# Patient Record
Sex: Female | Born: 1995 | Race: Black or African American | Hispanic: No | Marital: Single | Smoking: Former smoker
Health system: Southern US, Community
[De-identification: ages and names within clinical notes are randomized; demographics above are authoritative.]

## PROBLEM LIST (undated history)

## (undated) DIAGNOSIS — N059 Unspecified nephritic syndrome with unspecified morphologic changes: Secondary | ICD-10-CM

## (undated) DIAGNOSIS — A549 Gonococcal infection, unspecified: Secondary | ICD-10-CM

## (undated) DIAGNOSIS — A5901 Trichomonal vulvovaginitis: Secondary | ICD-10-CM

## (undated) DIAGNOSIS — I10 Essential (primary) hypertension: Secondary | ICD-10-CM

## (undated) DIAGNOSIS — N049 Nephrotic syndrome with unspecified morphologic changes: Secondary | ICD-10-CM

## (undated) HISTORY — PX: RENAL BIOPSY: SHX156

## (undated) HISTORY — DX: Essential (primary) hypertension: I10

## (undated) HISTORY — DX: Trichomonal vulvovaginitis: A59.01

## (undated) HISTORY — DX: Gonococcal infection, unspecified: A54.9

---
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
## 2017-01-21 ENCOUNTER — Encounter (HOSPITAL_COMMUNITY): Payer: Self-pay | Admitting: *Deleted

## 2017-01-21 ENCOUNTER — Emergency Department (HOSPITAL_COMMUNITY)
Admission: EM | Admit: 2017-01-21 | Discharge: 2017-01-21 | Disposition: A | Discharge status: Home / Self Care | Payer: Medicaid Other | Attending: Emergency Medicine | Admitting: Emergency Medicine

## 2017-01-21 DIAGNOSIS — N182 Chronic kidney disease, stage 2 (mild): Secondary | ICD-10-CM | POA: Diagnosis not present

## 2017-01-21 DIAGNOSIS — R040 Epistaxis: Secondary | ICD-10-CM

## 2017-01-21 DIAGNOSIS — Z79899 Other long term (current) drug therapy: Secondary | ICD-10-CM | POA: Diagnosis not present

## 2017-01-21 DIAGNOSIS — I129 Hypertensive chronic kidney disease with stage 1 through stage 4 chronic kidney disease, or unspecified chronic kidney disease: Secondary | ICD-10-CM | POA: Diagnosis not present

## 2017-01-21 HISTORY — DX: Nephrotic syndrome with unspecified morphologic changes: N04.9

## 2017-01-21 MED ORDER — SILVER NITRATE-POT NITRATE 75-25 % EX MISC
1.0000 | Freq: Once | CUTANEOUS | Status: AC
  Administered 2017-01-21: 1 via TOPICAL
  Filled 2017-01-21: qty 1

## 2017-01-21 NOTE — ED Triage Notes (Signed)
Per pt's friend, pt has had a cold and started having a nosebleed tonight. Pt with tshirt to nose, minimal amount of bleeding noted to shirt

## 2017-01-21 NOTE — ED Provider Notes (Signed)
[facility]-EMERGENCY DEPT Provider Note   CSN: [id number] Arrival date & time: 01/21/17  0147  By signing my name below, I, Bing Neighbors., attest that this documentation has been prepared under the direction and in the presence of Tomasita Crumble, MD. Electronically signed: Bing Neighbors., ED Scribe. 01/21/17. 2:11 AM.   History   Chief Complaint Chief Complaint  Patient presents with  . Epistaxis    HPI  Kelsey Ramsey is a 21 y.o. female who presents to the Emergency Department complaining of epistaxis with sudden onset x30 minutes. Pt states that she became hot x30 minutes ago and started experiencing epistaxis from the R naris. This episode lasted approximately x10 minutes and pt states that nothing relieved the nosebleed. Pt reports sneezing, congestion, cough. She denies any modifying factors. Pt denies blood thinner use. Of note, pt states that she has been blowing her nose excessively lately due to a recent illness.    HPI  Past Medical History:  Diagnosis Date  . Hypertension   . Nephritic syndrome   . Nephrotic syndrome     Patient Active Problem List   Diagnosis Date Noted  . 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 Nephrotic syndrome, focal and segmental glomerular lesions 04/06/2016  . Renal hypertension 04/06/2016  . Abdominal wall abscess 10/04/2015  . Abdominal pain, left upper quadrant 09/27/2015  . Subcutaneous mass 09/27/2015  . Chronic kidney disease 08/10/2014  . Chronic kidney disease (CKD) stage G3a/A2, moderately decreased glomerular filtration rate (GFR) between 45-59 mL/min/1.73 square meter and albuminuria creatinine ratio between 30-299 mg/g 08/10/2014  . Therapeutic drug monitoring 05/12/2012  . History of TB (tuberculosis) 02/11/2012  . History of tuberculosis 02/11/2012    Past Surgical History:  Procedure Laterality Date  . RENAL  BIOPSY      OB History    Gravida Para Term Preterm AB Living   0 0 0 0 0 0   SAB TAB Ectopic Multiple Live Births   0 0 0 0 0       Home Medications    Prior to Admission medications   Medication Sig Start Date End Date Taking? Authorizing Provider  clindamycin-benzoyl peroxide (BENZACLIN) gel Apply 1 application topically at bedtime. For acne 04/06/16   Historical Provider, MD  ibuprofen (ADVIL,MOTRIN) 600 MG tablet Take 1 tablet (600 mg total) by mouth every 6 (six) hours as needed for fever or mild pain. 11/01/14   Marcellina Millin, MD  labetalol (NORMODYNE) 300 MG tablet Take 300 mg by mouth 2 (two) times daily.    Historical Provider, MD  losartan (COZAAR) 100 MG tablet Take 1 tablet by mouth daily. 03/17/16   Historical Provider, MD  medroxyPROGESTERone (DEPO-PROVERA) 150 MG/ML injection Inject 1 mL (150 mg total) into the muscle every 3 (three) months. 07/02/16   Brock Bad, MD    Family History Family History  Problem Relation Age of Onset  . Diabetes Paternal Grandfather   . Stroke Maternal Grandmother   . Cancer Maternal Grandfather   . Diabetes Maternal Grandfather   . Hypertension Mother   . Heart disease Mother   . Thyroid disease Mother     Social History Social History  Substance Use Topics  . Smoking status: Never Smoker  . Smokeless tobacco: Never Used  . Alcohol use No     Allergies   Pineapple   Review of Systems Review of Systems  A  complete 10 system review of systems was obtained and all systems are negative except as noted in the HPI and PMH.    Physical Exam Updated Vital Signs BP 139/83 (BP Location: Left Arm)   Pulse 114   Temp 99.2 F (37.3 C) (Oral)   Resp 16   SpO2 96%   Physical Exam  Constitutional: She is oriented to person, place, and time. She appears well-developed and well-nourished. No distress.  HENT:  Head: Normocephalic and atraumatic.  Nose: Epistaxis is observed.  Mouth/Throat: Oropharynx is clear and  moist. No oropharyngeal exudate.  Dried blood in the R naris.   Eyes: Conjunctivae and EOM are normal. Pupils are equal, round, and reactive to light. No scleral icterus.  Neck: Normal range of motion. Neck supple. No JVD present. No tracheal deviation present. No thyromegaly present.  Cardiovascular: Normal rate, regular rhythm and normal heart sounds.  Exam reveals no gallop and no friction rub.   No murmur heard. Pulmonary/Chest: Effort normal and breath sounds normal. No respiratory distress. She has no wheezes. She exhibits no tenderness.  Abdominal: Soft. Bowel sounds are normal. She exhibits no distension and no mass. There is no tenderness. There is no rebound and no guarding.  Musculoskeletal: Normal range of motion. She exhibits no edema or tenderness.  Lymphadenopathy:    She has no cervical adenopathy.  Neurological: She is alert and oriented to person, place, and time. Tremors: .aon. No cranial nerve deficit. She exhibits normal muscle tone.  Normal strength and sensation in all extremities, normal cerebellar testing.     Skin: Skin is warm and dry. No rash noted. No erythema. No pallor.  Nursing note and vitals reviewed.    ED Treatments / Results   DIAGNOSTIC STUDIES: Oxygen Saturation is 96% on RA, adequate by my interpretation.   COORDINATION OF CARE: 2:11 AM-Discussed next steps with pt. Pt verbalized understanding and is agreeable with the plan.    Labs (all labs ordered are listed, but only abnormal results are displayed) Labs Reviewed - No data to display  EKG  EKG Interpretation None       Radiology No results found.  Procedures Procedures (including critical care time)  Medications Ordered in ED Medications  silver nitrate applicators applicator 1 Stick (not administered)     Initial Impression / Assessment and Plan / ED Course  I have reviewed the triage vital signs and the nursing notes.  Pertinent labs & imaging results that were  available during my care of the patient were reviewed by me and considered in my medical decision making (see chart for details).     Patient presents to the ED for epistaxis on the R side.  It has resolved and there is no further evidence or active bleed.  She was advised of nasal precautions going home and given PCP fu.  She appears well and in NAD. Vs remain within her normal limits and she is safe for DC.     Final Clinical Impressions(s) / ED Diagnoses   Final diagnoses:  None    New Prescriptions New Prescriptions   No medications on file      I personally performed the services described in this documentation, which was scribed in my presence. The recorded information has been reviewed and is accurate.       Tomasita CrumbleAdeleke Aitana Burry, MD 01/21/17 [phone]

## 2017-02-03 ENCOUNTER — Encounter (HOSPITAL_COMMUNITY): Payer: Self-pay | Admitting: *Deleted

## 2017-02-03 ENCOUNTER — Inpatient Hospital Stay (HOSPITAL_COMMUNITY)
Admission: AD | Admit: 2017-02-03 | Discharge: 2017-02-03 | Disposition: A | Discharge status: Home / Self Care | Payer: Medicaid Other | Source: Ambulatory Visit | Attending: Family Medicine | Admitting: Family Medicine

## 2017-02-03 DIAGNOSIS — N898 Other specified noninflammatory disorders of vagina: Secondary | ICD-10-CM | POA: Insufficient documentation

## 2017-02-03 DIAGNOSIS — X58XXXA Exposure to other specified factors, initial encounter: Secondary | ICD-10-CM | POA: Diagnosis not present

## 2017-02-03 DIAGNOSIS — L259 Unspecified contact dermatitis, unspecified cause: Secondary | ICD-10-CM | POA: Diagnosis not present

## 2017-02-03 DIAGNOSIS — N049 Nephrotic syndrome with unspecified morphologic changes: Secondary | ICD-10-CM | POA: Diagnosis not present

## 2017-02-03 DIAGNOSIS — I10 Essential (primary) hypertension: Secondary | ICD-10-CM | POA: Diagnosis not present

## 2017-02-03 NOTE — Discharge Instructions (Signed)
Bacitracin; Neomycin; Polymyxin B skin ointment What is this medicine? BACITRACIN; NEOMYCIN; POLYMYXIN (bass i TRAY sin; nee oh MYE sin; pol i MIX in) is used to treat skin infections. This medicine may be used for other purposes; ask your health care provider or pharmacist if you have questions. COMMON BRAND NAME(S): Neosporin What should I tell my health care provider before I take this medicine? They need to know if you have any of these conditions: -animal bite -deep wound -serious burn -an unusual or allergic reaction to this bacitracin, neomycin, polymyxin, other medicines, foods, dyes, or preservatives -pregnant or trying to get pregnant -breast-feeding How should I use this medicine? This medicine is for external use only. Do not take by mouth. Follow the directions on the prescription label. Wash hands before and after use. Apply a thin film of medicine to the affected area. Use your doses at regular intervals. Do not use your medicine more often than directed. Talk to your pediatrician regarding the use of this medicine in children. Special care may be needed. Overdosage: If you think you have taken too much of this medicine contact a poison control center or emergency room at once. NOTE: This medicine is only for you. Do not share this medicine with others. What if I miss a dose? If you miss a dose, use it as soon as you can. If it is almost time for your next dose, use only that dose. Do not use double or extra doses. What may interact with this medicine? Interactions are not expected. Do not use any other skin products on the affected area without asking your doctor or health care professional. This list may not describe all possible interactions. Give your health care provider a list of all the medicines, herbs, non-prescription drugs, or dietary supplements you use. Also tell them if you smoke, drink alcohol, or use illegal drugs. Some items may interact with your medicine. What  should I watch for while using this medicine? Tell your doctor or health care professional if your symptoms do not get better or if they get worse. Do not use longer than 7 days unless instructed by your doctor. What side effects may I notice from receiving this medicine? Side effects that you should report to your doctor or health care professional as soon as possible: -allergic reactions like skin rash, itching or hives, swelling of the face, lips, or tongue -infection, redness, swelling of skin This list may not describe all possible side effects. Call your doctor for medical advice about side effects. You may report side effects to FDA at 1-800-FDA-1088. Where should I keep my medicine? Keep out of the reach of children. Store at room temperature between 20 and 25 degrees C (68 and 77 degrees F). Throw away any unused medicine after the expiration date. NOTE: This sheet is a summary. It may not cover all possible information. If you have questions about this medicine, talk to your doctor, pharmacist, or health care provider.  2018 Elsevier/Gold Standard (2015-12-12 11:00:08)  

## 2017-02-03 NOTE — MAU Note (Signed)
PT SAYS  SHE  SHAVED  HER PUBIC AREA ON   3-4   AND  AGAIN ON   3-6  - THEN  SHE WAS  SPRAYING  PERFUME  ON BODY  AND   IT GOT  ON PUBIC  AREA-   AND  NOW  BURNS   - WHERE   SHE  HAS  CUTS   .    TAKES  DEPO.    NO BLEEDING

## 2017-02-03 NOTE — MAU Provider Note (Signed)
Patient Kelsey Ramsey is a 21 year old non-pregnant female here with complaints of vaginal irritation after shaving and using body spray.  History   Patient states that she was shaving her labia on 3-4 and felt some irritation after she shaved She shaved a few days later again on the 6th  and felt like she cut herself. She then used body spray and bath salts to try to make the pain go away. When she then looked in a hand mirror she saw that she had cut herself. Over the past week she has been washing with body wash to try to make it feel better.    She denies any itching or tingling or unusual discharge or pain with urination.   She is here because she is worried and wants to be checked out.   She called [facility] but wasn't able to get an appointment until April.  CSN: [id number]  Arrival date and time: 02/03/17 0129   None     Chief Complaint  Patient presents with  . VAG IRRITATION   HPI  OB History    Gravida Para Term Preterm AB Living   0 0 0 0 0 0   SAB TAB Ectopic Multiple Live Births   0 0 0 0 0      Past Medical History:  Diagnosis Date  . Hypertension   . Nephritic syndrome   . Nephrotic syndrome     Past Surgical History:  Procedure Laterality Date  . RENAL BIOPSY      Family History  Problem Relation Age of Onset  . Diabetes Paternal Grandfather   . Stroke Maternal Grandmother   . Cancer Maternal Grandfather   . Diabetes Maternal Grandfather   . Hypertension Mother   . Heart disease Mother   . Thyroid disease Mother     Social History  Substance Use Topics  . Smoking status: Never Smoker  . Smokeless tobacco: Never Used  . Alcohol use No    Allergies:  Allergies  Allergen Reactions  . Pineapple Itching and Swelling    Prescriptions Prior to Admission  Medication Sig Dispense Refill Last Dose  . clindamycin-benzoyl peroxide (BENZACLIN) gel Apply 1 application topically at bedtime. For acne   Taking  . ibuprofen (ADVIL,MOTRIN) 600 MG tablet  Take 1 tablet (600 mg total) by mouth every 6 (six) hours as needed for fever or mild pain. 30 tablet 0 Taking  . labetalol (NORMODYNE) 300 MG tablet Take 300 mg by mouth 2 (two) times daily.   Taking  . losartan (COZAAR) 100 MG tablet Take 1 tablet by mouth daily.   Taking  . medroxyPROGESTERone (DEPO-PROVERA) 150 MG/ML injection Inject 1 mL (150 mg total) into the muscle every 3 (three) months. 1 mL 3 Taking    Review of Systems  Constitutional: Negative.   HENT: Negative.   Eyes: Negative.   Respiratory: Negative.   Cardiovascular: Negative.   Gastrointestinal: Negative.   Endocrine: Negative.   Genitourinary: Negative.   Musculoskeletal: Negative.   Allergic/Immunologic: Negative.   Neurological: Negative.   Hematological: Negative.   Psychiatric/Behavioral: Negative.    Physical Exam   Blood pressure 118/62, pulse 100, temperature 97.9 F (36.6 C), temperature source Oral, resp. rate 18, height 5\' 8"  (1.727 m), weight 87.9 kg (193 lb 12 oz).  Physical Exam  Constitutional: She is oriented to person, place, and time. She appears well-developed.  Neck: Normal range of motion.  Respiratory: Effort normal. No respiratory distress.  Genitourinary: Vagina normal.  Genitourinary Comments: NEFG with 7 small red lacerations on her labia. No pus, no oozing, no redness or signs of infection. Cool and dry to the touch.   Musculoskeletal: Normal range of motion.  Neurological: She is alert and oriented to person, place, and time.  Skin: Skin is warm and dry.  Psychiatric: She has a normal mood and affect.    MAU Course  Procedures  MDM -physical exam. Unlikely to be herpetic lesions as patient states that the irritation began immediately after shaving, and she felt no bumps, tingling or burning. Red areas look like small abrasions and not blisters that have been de-roofed.   Assessment and Plan   1. Skin irritation from shaving    2. Patient stable for discharge with  recommendations to apply neosporin at bedtime and in the morning, avoid immersion in water and stop using body washes and sprays. Abstain from intercourse until skin has healed.  Instructed patient to return to ED if she felt no relief after 3 days or make an appointment with [facility] to be seen next week.   Charlesetta Garibaldi Kooistra CNM 02/03/2017, 2:51 AM

## 2017-03-15 ENCOUNTER — Ambulatory Visit: Payer: Medicaid Other

## 2017-03-24 ENCOUNTER — Ambulatory Visit (INDEPENDENT_AMBULATORY_CARE_PROVIDER_SITE_OTHER): Payer: Medicaid Other

## 2017-03-24 VITALS — BP 136/83 | HR 98 | Wt 194.8 lb

## 2017-03-24 DIAGNOSIS — Z3042 Encounter for surveillance of injectable contraceptive: Secondary | ICD-10-CM | POA: Diagnosis not present

## 2017-03-24 MED ORDER — MEDROXYPROGESTERONE ACETATE 150 MG/ML IM SUSP
150.0000 mg | Freq: Once | INTRAMUSCULAR | Status: AC
  Administered 2017-03-24: 150 mg via INTRAMUSCULAR

## 2017-03-24 NOTE — Progress Notes (Signed)
Patient presents for DEPO. Given in RUOQ. Tolerated well.  Return for next DEPO 7/18-06/23/2017  Administrations This Visit    medroxyPROGESTERone (DEPO-PROVERA) injection 150 mg    Admin Date 03/24/2017 Action Given Dose 150 mg Route Intramuscular Administered By Maretta Bees, RMA

## 2017-04-18 ENCOUNTER — Emergency Department (HOSPITAL_COMMUNITY)
Admission: EM | Admit: 2017-04-18 | Discharge: 2017-04-18 | Disposition: A | Discharge status: Home / Self Care | Payer: Medicaid Other | Attending: Emergency Medicine | Admitting: Emergency Medicine

## 2017-04-18 ENCOUNTER — Encounter (HOSPITAL_COMMUNITY): Payer: Self-pay

## 2017-04-18 DIAGNOSIS — J020 Streptococcal pharyngitis: Secondary | ICD-10-CM | POA: Diagnosis not present

## 2017-04-18 DIAGNOSIS — Z79899 Other long term (current) drug therapy: Secondary | ICD-10-CM | POA: Insufficient documentation

## 2017-04-18 DIAGNOSIS — F341 Dysthymic disorder: Secondary | ICD-10-CM | POA: Insufficient documentation

## 2017-04-18 DIAGNOSIS — F329 Major depressive disorder, single episode, unspecified: Secondary | ICD-10-CM | POA: Diagnosis present

## 2017-04-18 DIAGNOSIS — N189 Chronic kidney disease, unspecified: Secondary | ICD-10-CM | POA: Insufficient documentation

## 2017-04-18 DIAGNOSIS — I129 Hypertensive chronic kidney disease with stage 1 through stage 4 chronic kidney disease, or unspecified chronic kidney disease: Secondary | ICD-10-CM | POA: Insufficient documentation

## 2017-04-18 LAB — RAPID URINE DRUG SCREEN, HOSP PERFORMED
AMPHETAMINES: NOT DETECTED
Barbiturates: NOT DETECTED
Benzodiazepines: NOT DETECTED
Cocaine: NOT DETECTED
Opiates: NOT DETECTED
Tetrahydrocannabinol: NOT DETECTED

## 2017-04-18 LAB — CBC
HEMATOCRIT: 42.1 % (ref 36.0–46.0)
HEMOGLOBIN: 14.6 g/dL (ref 12.0–15.0)
MCH: 29 pg (ref 26.0–34.0)
MCHC: 34.7 g/dL (ref 30.0–36.0)
MCV: 83.7 fL (ref 78.0–100.0)
PLATELETS: 230 10*3/uL (ref 150–400)
RBC: 5.03 MIL/uL (ref 3.87–5.11)
RDW: 14.3 % (ref 11.5–15.5)
WBC: 10.4 10*3/uL (ref 4.0–10.5)

## 2017-04-18 LAB — COMPREHENSIVE METABOLIC PANEL
ALBUMIN: 4.3 g/dL (ref 3.5–5.0)
ALT: 29 U/L (ref 14–54)
ANION GAP: 7 (ref 5–15)
AST: 36 U/L (ref 15–41)
Alkaline Phosphatase: 60 U/L (ref 38–126)
BUN: 17 mg/dL (ref 6–20)
CO2: 25 mmol/L (ref 22–32)
Calcium: 9.8 mg/dL (ref 8.9–10.3)
Chloride: 108 mmol/L (ref 101–111)
Creatinine, Ser: 1.25 mg/dL — ABNORMAL HIGH (ref 0.44–1.00)
GFR calc Af Amer: >60 mL/min (ref >60)
GFR calc non Af Amer: >60 mL/min (ref >60)
GLUCOSE: 94 mg/dL (ref 65–99)
POTASSIUM: 3.9 mmol/L (ref 3.5–5.1)
SODIUM: 140 mmol/L (ref 135–145)
Total Bilirubin: 0.3 mg/dL (ref 0.3–1.2)
Total Protein: 8.2 g/dL — ABNORMAL HIGH (ref 6.5–8.1)

## 2017-04-18 LAB — ETHANOL: Alcohol, Ethyl (B): <5 mg/dL (ref <5)

## 2017-04-18 LAB — SALICYLATE LEVEL

## 2017-04-18 LAB — I-STAT BETA HCG BLOOD, ED (MC, WL, AP ONLY)

## 2017-04-18 LAB — ACETAMINOPHEN LEVEL

## 2017-04-18 MED ORDER — AMOXICILLIN 500 MG PO CAPS: 1000.0000 mg | ORAL_CAPSULE | Freq: Two times a day (BID) | ORAL | 0 refills | Status: DC

## 2017-04-18 NOTE — ED Provider Notes (Signed)
[facility]-EMERGENCY DEPT Provider Note   CSN: [id number] Arrival date & time: 04/18/17  8295     History   Chief Complaint Chief Complaint  Patient presents with  . Depression    HPI Kelsey Ramsey is a 21 y.o. female.  21 yo F with a chief complaint of depression. Patient states that she's felt down for the past couple weeks to a month. She thinks is due to relationship issues and the fact that she's not getting along well with her siblings. She denies suicidal or homicidal ideation. Denies illegal drug use. Denies hallucinations.  Patient was noted to have a fever in triage. She said she's been having a sore throat. Mild headache. Has coughed once or twice over the past couple days. Going on for 2 or 3 days. Denies sick contacts.   The history is provided by the patient.  Depression  This is a new problem. The current episode started more than 2 days ago. The problem occurs constantly. The problem has not changed since onset.Pertinent negatives include no chest pain, no headaches and no shortness of breath. Nothing aggravates the symptoms. Nothing relieves the symptoms. She has tried nothing for the symptoms. The treatment provided no relief.    Past Medical History:  Diagnosis Date  . Hypertension   . Nephritic syndrome   . Nephrotic syndrome     Patient Active Problem List   Diagnosis Date Noted  . 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 Nephrotic syndrome, focal and segmental glomerular lesions 04/06/2016  . Renal hypertension 04/06/2016  . Abdominal wall abscess 10/04/2015  . Abdominal pain, left upper quadrant 09/27/2015  . Subcutaneous mass 09/27/2015  . Chronic kidney disease 08/10/2014  . Chronic kidney disease (CKD) stage G3a/A2, moderately decreased glomerular filtration rate (GFR) between 45-59 mL/min/1.73 square meter and albuminuria creatinine ratio between 30-299 mg/g  08/10/2014  . Therapeutic drug monitoring 05/12/2012  . History of TB (tuberculosis) 02/11/2012  . History of tuberculosis 02/11/2012    Past Surgical History:  Procedure Laterality Date  . RENAL BIOPSY      OB History    Gravida Para Term Preterm AB Living   0 0 0 0 0 0   SAB TAB Ectopic Multiple Live Births   0 0 0 0 0       Home Medications    Prior to Admission medications   Medication Sig Start Date End Date Taking? Authorizing Provider  losartan (COZAAR) 100 MG tablet Take 100 mg by mouth daily.  03/17/16  Yes [provider]  medroxyPROGESTERone (DEPO-PROVERA) 150 MG/ML injection Inject 1 mL (150 mg total) into the muscle every 3 (three) months. 07/02/16  Yes Brock Bad, MD  amoxicillin (AMOXIL) 500 MG capsule Take 2 capsules (1,000 mg total) by mouth 2 (two) times daily. 04/18/17   Melene Plan, DO    Family History Family History  Problem Relation Age of Onset  . Diabetes Paternal Grandfather   . Stroke Maternal Grandmother   . Cancer Maternal Grandfather   . Diabetes Maternal Grandfather   . Hypertension Mother   . Heart disease Mother   . Thyroid disease Mother     Social History Social History  Substance Use Topics  . Smoking status: Never Smoker  . Smokeless tobacco: Never Used  . Alcohol use No     Allergies   Pineapple   Review of Systems Review of Systems  Constitutional: Positive  for fever. Negative for chills.  HENT: Positive for sore throat. Negative for congestion and rhinorrhea.   Eyes: Negative for redness and visual disturbance.  Respiratory: Positive for cough. Negative for shortness of breath and wheezing.   Cardiovascular: Negative for chest pain and palpitations.  Gastrointestinal: Negative for nausea and vomiting.  Genitourinary: Negative for dysuria and urgency.  Musculoskeletal: Negative for arthralgias and myalgias.  Skin: Negative for pallor and wound.  Neurological: Negative for dizziness and headaches.    Psychiatric/Behavioral: Positive for depression and dysphoric mood. Negative for hallucinations, self-injury and suicidal ideas.     Physical Exam Updated Vital Signs BP (!) 144/82 (BP Location: Left Arm)   Pulse (!) 116   Temp (!) 100.5 F (38.1 C) (Oral)   Resp 18   Ht 5\' 8"  (1.727 m)   Wt 90.3 kg (199 lb)   SpO2 98%   BMI 30.26 kg/m   Physical Exam  Constitutional: She is oriented to person, place, and time. She appears well-developed and well-nourished. No distress.  HENT:  Head: Normocephalic and atraumatic.  Purulent tonsils bilaterally. Uvula is midline. Handling secretions without difficulty. Left-sided tender anterior cervical lymphadenopathy. TMs are normal.  Eyes: EOM are normal. Pupils are equal, round, and reactive to light.  Neck: Normal range of motion. Neck supple.  Cardiovascular: Normal rate and regular rhythm.  Exam reveals no gallop and no friction rub.   No murmur heard. Pulmonary/Chest: Effort normal. She has no wheezes. She has no rales.  Abdominal: Soft. She exhibits no distension. There is no tenderness.  Musculoskeletal: She exhibits no edema or tenderness.  Neurological: She is alert and oriented to person, place, and time.  Skin: Skin is warm and dry. She is not diaphoretic.  Psychiatric: She has a normal mood and affect. Her behavior is normal.  Nursing note and vitals reviewed.    ED Treatments / Results  Labs (all labs ordered are listed, but only abnormal results are displayed) Labs Reviewed  COMPREHENSIVE METABOLIC PANEL - Abnormal; Notable for the following:       Result Value   Creatinine, Ser 1.25 (*)    Total Protein 8.2 (*)    All other components within normal limits  ACETAMINOPHEN LEVEL - Abnormal; Notable for the following:    Acetaminophen (Tylenol), Serum <10 (*)    All other components within normal limits  ETHANOL  SALICYLATE LEVEL  CBC  RAPID URINE DRUG SCREEN, HOSP PERFORMED  I-STAT BETA HCG BLOOD, ED ([facility], [facility], [facility]  ONLY)    EKG  EKG Interpretation None       Radiology No results found.  Procedures Procedures (including critical care time)  Medications Ordered in ED Medications - No data to display   Initial Impression / Assessment and Plan / ED Course  I have reviewed the triage vital signs and the nursing notes.  Pertinent labs & imaging results that were available during my care of the patient were reviewed by me and considered in my medical decision making (see chart for details).     21 yo F With a chief complaint of depression. I discussed the limitations of the emergency department in offering counseling services. I discussed that we're here to screen the see if she is safe to be discharged home. The patient does not feel that she needs to be committed to a psychiatric facility. She denies suicidal or homicidal ideation. Denies hallucinations. I will give her a list of resources to see as an outpatient.  Patient also was  incidentally found to have a fever. Clinically she has strep pharyngitis. Will treat with antibiotics. PCP follow-up.  9:19 AM:  I have discussed the diagnosis/risks/treatment options with the patient and family and believe the pt to be eligible for discharge home to follow-up with Psych, PCP. We also discussed returning to the ED immediately if new or worsening sx occur. We discussed the sx which are most concerning (e.g., sudden worsening pain, fever, inability to tolerate by mouth, SI) that necessitate immediate return. Medications administered to the patient during their visit and any new prescriptions provided to the patient are listed below.  Medications given during this visit Medications - No data to display   The patient appears reasonably screen and/or stabilized for discharge and I doubt any other medical condition or other [facility] requiring further screening, evaluation, or treatment in the ED at this time prior to discharge.    Final Clinical  Impressions(s) / ED Diagnoses   Final diagnoses:  Dysthymia  Strep pharyngitis    New Prescriptions New Prescriptions   AMOXICILLIN (AMOXIL) 500 MG CAPSULE    Take 2 capsules (1,000 mg total) by mouth 2 (two) times daily.     Melene Plan, DO 04/18/17 [phone]

## 2017-04-18 NOTE — ED Notes (Signed)
Pt came in voluntarily for assessment due to family and relationship problems. Pt states SI, but also says she wouldn't do it.

## 2017-04-18 NOTE — ED Notes (Signed)
Pt c/o having a "rough time" feeling overwhelmed, depressed, anxious. Pt stated had suicidal ideations, but does not currently feel that way. Pt has had recent disagreements with boyfriend and family.

## 2017-04-18 NOTE — ED Notes (Signed)
Pt states she wants to leave. Asked patient to stay and see MD. Pt agreed, waiting for room.

## 2017-04-25 ENCOUNTER — Inpatient Hospital Stay (HOSPITAL_COMMUNITY)
Admission: AD | Admit: 2017-04-25 | Discharge: 2017-04-26 | Disposition: A | Discharge status: Home / Self Care | Payer: Medicaid Other | Source: Ambulatory Visit | Attending: Obstetrics and Gynecology | Admitting: Obstetrics and Gynecology

## 2017-04-25 DIAGNOSIS — Z113 Encounter for screening for infections with a predominantly sexual mode of transmission: Secondary | ICD-10-CM | POA: Insufficient documentation

## 2017-04-25 DIAGNOSIS — Z3202 Encounter for pregnancy test, result negative: Secondary | ICD-10-CM | POA: Diagnosis not present

## 2017-04-25 DIAGNOSIS — I10 Essential (primary) hypertension: Secondary | ICD-10-CM | POA: Diagnosis not present

## 2017-04-25 DIAGNOSIS — Z711 Person with feared health complaint in whom no diagnosis is made: Secondary | ICD-10-CM

## 2017-04-25 DIAGNOSIS — Z79899 Other long term (current) drug therapy: Secondary | ICD-10-CM | POA: Insufficient documentation

## 2017-04-25 LAB — POCT PREGNANCY, URINE: Preg Test, Ur: NEGATIVE

## 2017-04-25 NOTE — MAU Provider Note (Signed)
History     CSN: [id number]  Arrival date and time: 04/25/17 2323  First Provider Initiated Contact with Patient 04/25/17 2349      Chief Complaint  Patient presents with  . STD testing   HPI Kelsey Ramsey is a 21 y.o. female who presents for STD check. Patient states she was recently sexually active with new partner & is concerned for STDs. Denies abdominal pain, vaginal discharge, vaginal irritation, vaginal bleeding, or dysuria. States partner does not have STD symptoms, nor has he been tested.   Past Medical History:  Diagnosis Date  . Hypertension   . Nephritic syndrome   . Nephrotic syndrome     Past Surgical History:  Procedure Laterality Date  . RENAL BIOPSY      Family History  Problem Relation Age of Onset  . Diabetes Paternal Grandfather   . Stroke Maternal Grandmother   . Cancer Maternal Grandfather   . Diabetes Maternal Grandfather   . Hypertension Mother   . Heart disease Mother   . Thyroid disease Mother     Social History  Substance Use Topics  . Smoking status: Never Smoker  . Smokeless tobacco: Never Used  . Alcohol use No    Allergies:  Allergies  Allergen Reactions  . Pineapple Itching and Swelling    Prescriptions Prior to Admission  Medication Sig Dispense Refill Last Dose  . amoxicillin (AMOXIL) 500 MG capsule Take 2 capsules (1,000 mg total) by mouth 2 (two) times daily. 40 capsule 0   . losartan (COZAAR) 100 MG tablet Take 100 mg by mouth daily.    04/16/2017 at Unknown time  . medroxyPROGESTERone (DEPO-PROVERA) 150 MG/ML injection Inject 1 mL (150 mg total) into the muscle every 3 (three) months. 1 mL 3 May    Review of Systems  Constitutional: Negative.   Gastrointestinal: Negative.   Genitourinary: Negative.    Physical Exam   Blood pressure 136/72, pulse (!) 103, temperature 98.9 F (37.2 C), temperature source Oral, resp. rate 18, height 5' 9.5" (1.765 m), weight 182 lb (82.6 kg), SpO2 97 %.  Physical Exam  Nursing note  and vitals reviewed. Constitutional: She is oriented to person, place, and time. She appears well-developed and well-nourished. No distress.  HENT:  Head: Normocephalic and atraumatic.  Eyes: Conjunctivae are normal. Right eye exhibits no discharge. Left eye exhibits no discharge. No scleral icterus.  Neck: Normal range of motion.  Respiratory: Effort normal. No respiratory distress.  Neurological: She is alert and oriented to person, place, and time.  Skin: Skin is warm and dry. She is not diaphoretic.  Psychiatric: She has a normal mood and affect. Her behavior is normal. Judgment and thought content normal.    MAU Course  Procedures Results for orders placed or performed during the hospital encounter of 04/25/17 (from the past 24 hour(s))  Pregnancy, urine POC     Status: None   Collection Time: 04/25/17 11:48 PM  Result Value Ref Range   Preg Test, Ur NEGATIVE NEGATIVE  Wet prep, genital     Status: Abnormal   Collection Time: 04/25/17 11:55 PM  Result Value Ref Range   Yeast Wet Prep HPF POC NONE SEEN NONE SEEN   Trich, Wet Prep NONE SEEN NONE SEEN   Clue Cells Wet Prep HPF POC NONE SEEN NONE SEEN   WBC, Wet Prep HPF POC FEW (A) NONE SEEN   Sperm NONE SEEN    MDM UPT negative GC/CT, wet prep, HIV, RPR  Assessment and  Plan  A: 1. Screen for STD (sexually transmitted disease)   2. Pregnancy examination or test, negative result   3. Physically well but worried    P: Discharge home GC/CT, HIV, & RPR pending Abstain from sex until results of testing Discussed [facility] STD clinic in the future for concerns or f/u with ob/gyn Safe sex practices discussed  Judeth Horn 04/25/2017, 11:49 PM

## 2017-04-25 NOTE — MAU Note (Signed)
Pt states she wants to be checked for STD's. Pt denies vaginal bleeding or discharge. Pt denies pain. Pt currently on Depo-last injection was in May.

## 2017-04-26 DIAGNOSIS — Z113 Encounter for screening for infections with a predominantly sexual mode of transmission: Secondary | ICD-10-CM | POA: Diagnosis not present

## 2017-04-26 LAB — WET PREP, GENITAL
Clue Cells Wet Prep HPF POC: NONE SEEN
SPERM: NONE SEEN
TRICH WET PREP: NONE SEEN
YEAST WET PREP: NONE SEEN

## 2017-04-26 LAB — RPR: RPR Ser Ql: NONREACTIVE

## 2017-04-26 LAB — HIV ANTIBODY (ROUTINE TESTING W REFLEX): HIV Screen 4th Generation wRfx: NONREACTIVE

## 2017-04-26 NOTE — Discharge Instructions (Signed)

## 2017-04-27 LAB — GC/CHLAMYDIA PROBE AMP (~~LOC~~) NOT AT ARMC
Chlamydia: NEGATIVE
NEISSERIA GONORRHEA: NEGATIVE

## 2017-04-30 ENCOUNTER — Encounter (HOSPITAL_COMMUNITY): Payer: Self-pay

## 2017-04-30 ENCOUNTER — Inpatient Hospital Stay (HOSPITAL_COMMUNITY)
Admission: AD | Admit: 2017-04-30 | Discharge: 2017-04-30 | Disposition: A | Discharge status: Home / Self Care | Payer: Medicaid Other | Source: Ambulatory Visit | Attending: Obstetrics & Gynecology | Admitting: Obstetrics & Gynecology

## 2017-04-30 DIAGNOSIS — N921 Excessive and frequent menstruation with irregular cycle: Secondary | ICD-10-CM | POA: Diagnosis not present

## 2017-04-30 DIAGNOSIS — R109 Unspecified abdominal pain: Secondary | ICD-10-CM | POA: Diagnosis not present

## 2017-04-30 DIAGNOSIS — N946 Dysmenorrhea, unspecified: Secondary | ICD-10-CM | POA: Insufficient documentation

## 2017-04-30 DIAGNOSIS — N939 Abnormal uterine and vaginal bleeding, unspecified: Secondary | ICD-10-CM | POA: Diagnosis present

## 2017-04-30 LAB — COMPREHENSIVE METABOLIC PANEL
ALK PHOS: 60 U/L (ref 38–126)
ALT: 25 U/L (ref 14–54)
ANION GAP: 8 (ref 5–15)
AST: 29 U/L (ref 15–41)
Albumin: 4 g/dL (ref 3.5–5.0)
BILIRUBIN TOTAL: 0.3 mg/dL (ref 0.3–1.2)
BUN: 12 mg/dL (ref 6–20)
CALCIUM: 9.8 mg/dL (ref 8.9–10.3)
CO2: 26 mmol/L (ref 22–32)
Chloride: 104 mmol/L (ref 101–111)
Creatinine, Ser: 1.06 mg/dL — ABNORMAL HIGH (ref 0.44–1.00)
GFR calc Af Amer: >60 mL/min (ref >60)
Glucose, Bld: 85 mg/dL (ref 65–99)
Potassium: 3.7 mmol/L (ref 3.5–5.1)
Sodium: 138 mmol/L (ref 135–145)
TOTAL PROTEIN: 8.1 g/dL (ref 6.5–8.1)

## 2017-04-30 LAB — URINALYSIS, ROUTINE W REFLEX MICROSCOPIC
Bacteria, UA: NONE SEEN
Bilirubin Urine: NEGATIVE
GLUCOSE, UA: NEGATIVE mg/dL
Ketones, ur: NEGATIVE mg/dL
NITRITE: NEGATIVE
PROTEIN: 100 mg/dL — AB
SPECIFIC GRAVITY, URINE: 1.015 (ref 1.005–1.030)
pH: 5 (ref 5.0–8.0)

## 2017-04-30 LAB — LIPASE, BLOOD: Lipase: 40 U/L (ref 11–51)

## 2017-04-30 LAB — CBC
HEMATOCRIT: 40.9 % (ref 36.0–46.0)
HEMOGLOBIN: 14.2 g/dL (ref 12.0–15.0)
MCH: 28.9 pg (ref 26.0–34.0)
MCHC: 34.7 g/dL (ref 30.0–36.0)
MCV: 83.3 fL (ref 78.0–100.0)
Platelets: 202 10*3/uL (ref 150–400)
RBC: 4.91 MIL/uL (ref 3.87–5.11)
RDW: 14.1 % (ref 11.5–15.5)
WBC: 6.5 10*3/uL (ref 4.0–10.5)

## 2017-04-30 LAB — POCT PREGNANCY, URINE: Preg Test, Ur: NEGATIVE

## 2017-04-30 MED ORDER — IBUPROFEN 600 MG PO TABS: 600.0000 mg | ORAL_TABLET | Freq: Four times a day (QID) | ORAL | 0 refills | Status: DC | PRN

## 2017-04-30 MED ORDER — IBUPROFEN 600 MG PO TABS
600.0000 mg | ORAL_TABLET | Freq: Four times a day (QID) | ORAL | Status: DC | PRN
  Administered 2017-04-30: 600 mg via ORAL
  Filled 2017-04-30: qty 1

## 2017-04-30 NOTE — MAU Provider Note (Signed)
Chief Complaint: Vaginal Bleeding and Abdominal Pain   First Provider Initiated Contact with Patient 04/30/17 1621      SUBJECTIVE HPI: Kelsey Ramsey is a 21 y.o. G0P0000 who presents to maternity admissions reporting abdominal cramping and abdominal pain starting yesterday. She reports the cramping started first, feeling like menstrual cramps, intermittent pain that is unchanged since onset.  Then, the bleeding started, light bleeding requiring a pantyliner only. She is on Depo Provera and does not usually have periods. She started amoxicillin a few days ago for strep throat. She took a pain medication from her mother today that did not help.  The pain does not radiate and bleeding is the only associated symptom.  She had STD testing on 04/25/17 which was negative and denies any sexual activity in recent weeks.  She is interested in other forms of birth control because of her weight gain on Depo. [facility]. She denies vaginal itching/burning, urinary symptoms, h/a, dizziness, n/v, or fever/chills.     HPI  Past Medical History:  Diagnosis Date  . Hypertension   . Nephritic syndrome   . Nephrotic syndrome    Past Surgical History:  Procedure Laterality Date  . RENAL BIOPSY     Social History   Social History  . Marital status: Single    Spouse name: N/A  . Number of children: N/A  . Years of education: N/A   Occupational History  . Not on file.   Social History Main Topics  . Smoking status: Never Smoker  . Smokeless tobacco: Never Used  . Alcohol use No  . Drug use: No  . Sexual activity: Not Currently    Birth control/ protection: Injection   Other Topics Concern  . Not on file   Social History Narrative  . No narrative on file   No current facility-administered medications on file prior to encounter.    Current Outpatient Prescriptions on File Prior to Encounter  Medication Sig Dispense Refill  . amoxicillin (AMOXIL)  500 MG capsule Take 2 capsules (1,000 mg total) by mouth 2 (two) times daily. 40 capsule 0  . losartan (COZAAR) 100 MG tablet Take 100 mg by mouth daily.     . medroxyPROGESTERone (DEPO-PROVERA) 150 MG/ML injection Inject 1 mL (150 mg total) into the muscle every 3 (three) months. 1 mL 3   Allergies  Allergen Reactions  . Pineapple Itching and Swelling    ROS:  Review of Systems  Constitutional: Negative for chills, fatigue and fever.  Respiratory: Negative for shortness of breath.   Cardiovascular: Negative for chest pain.  Gastrointestinal: Positive for abdominal pain.  Genitourinary: Positive for pelvic pain. Negative for difficulty urinating, dysuria, flank pain, vaginal bleeding, vaginal discharge and vaginal pain.  Neurological: Negative for dizziness and headaches.  Psychiatric/Behavioral: Negative.      I have reviewed patient's Past Medical Hx, Surgical Hx, Family Hx, Social Hx, medications and allergies.   Physical Exam   Patient Vitals for the past 24 hrs:  BP Temp Temp src Pulse Resp Weight  04/30/17 1522 126/75 98.9 F (37.2 C) Oral 97 18 195 lb 4 oz (88.6 kg)   Constitutional: Well-developed, well-nourished female in no acute distress.  Cardiovascular: normal rate Respiratory: normal effort GI: Abd soft, non-tender. Pos BS x 4 MS: Extremities nontender, no edema, normal ROM Neurologic: Alert and oriented x 4.  GU: Neg CVAT.  PELVIC EXAM: Deferred   LAB RESULTS Results for orders placed or  performed during the hospital encounter of 04/30/17 (from the past 24 hour(s))  Pregnancy, urine POC     Status: None   Collection Time: 04/30/17  3:34 PM  Result Value Ref Range   Preg Test, Ur NEGATIVE NEGATIVE  Urinalysis, Routine w reflex microscopic     Status: Abnormal   Collection Time: 04/30/17  3:35 PM  Result Value Ref Range   Color, Urine YELLOW YELLOW   APPearance HAZY (A) CLEAR   Specific Gravity, Urine 1.015 1.005 - 1.030   pH 5.0 5.0 - 8.0   Glucose,  UA NEGATIVE NEGATIVE mg/dL   Hgb urine dipstick LARGE (A) NEGATIVE   Bilirubin Urine NEGATIVE NEGATIVE   Ketones, ur NEGATIVE NEGATIVE mg/dL   Protein, ur 161 (A) NEGATIVE mg/dL   Nitrite NEGATIVE NEGATIVE   Leukocytes, UA TRACE (A) NEGATIVE   RBC / HPF 6-30 0 - 5 RBC/hpf   WBC, UA 0-5 0 - 5 WBC/hpf   Bacteria, UA NONE SEEN NONE SEEN   Squamous Epithelial / LPF 6-30 (A) NONE SEEN   Mucous PRESENT   CBC     Status: None   Collection Time: 04/30/17  4:51 PM  Result Value Ref Range   WBC 6.5 4.0 - 10.5 K/uL   RBC 4.91 3.87 - 5.11 MIL/uL   Hemoglobin 14.2 12.0 - 15.0 g/dL   HCT 09.6 04.5 - 40.9 %   MCV 83.3 78.0 - 100.0 fL   MCH 28.9 26.0 - 34.0 pg   MCHC 34.7 30.0 - 36.0 g/dL   RDW 81.1 91.4 - 78.2 %   Platelets 202 150 - 400 K/uL  Comprehensive metabolic panel     Status: Abnormal   Collection Time: 04/30/17  4:51 PM  Result Value Ref Range   Sodium 138 135 - 145 mmol/L   Potassium 3.7 3.5 - 5.1 mmol/L   Chloride 104 101 - 111 mmol/L   CO2 26 22 - 32 mmol/L   Glucose, Bld 85 65 - 99 mg/dL   BUN 12 6 - 20 mg/dL   Creatinine, Ser 9.56 (H) 0.44 - 1.00 mg/dL   Calcium 9.8 8.9 - 21.3 mg/dL   Total Protein 8.1 6.5 - 8.1 g/dL   Albumin 4.0 3.5 - 5.0 g/dL   AST 29 15 - 41 U/L   ALT 25 14 - 54 U/L   Alkaline Phosphatase 60 38 - 126 U/L   Total Bilirubin 0.3 0.3 - 1.2 mg/dL   GFR calc non Af Amer >60 >60 mL/min   GFR calc Af Amer >60 >60 mL/min   Anion gap 8 5 - 15  Lipase, blood     Status: None   Collection Time: 04/30/17  4:51 PM  Result Value Ref Range   Lipase 40 11 - 51 U/L       IMAGING No results found.  MAU Management/MDM: Ordered labs and reviewed results.  Likely irregular menstrual bleeding related to antibiotics on Depo.  Will treat with ibuprofen 600 mg PO here in MAU and Rx for home.  Pt to f/u with [facility] for change in contraceptive method if desired. Discussed LARCs as most effective forms of birth control. Pt stable at time of discharge.  ASSESSMENT 1.  Breakthrough bleeding on Depo-Provera   2. Dysmenorrhea in adolescent     PLAN Discharge home  Allergies as of 04/30/2017      Reactions   Pineapple Itching, Swelling      Medication List    TAKE these medications  amoxicillin 500 MG capsule Commonly known as:  AMOXIL Take 2 capsules (1,000 mg total) by mouth 2 (two) times daily.   BIOTIN PO Take 2 capsules by mouth daily. OTC vitamin, pt not sure of strength/name- for hair and nails   ibuprofen 600 MG tablet Commonly known as:  ADVIL,MOTRIN Take 1 tablet (600 mg total) by mouth every 6 (six) hours as needed for fever or headache.   losartan 100 MG tablet Commonly known as:  COZAAR Take 100 mg by mouth daily.   medroxyPROGESTERone 150 MG/ML injection Commonly known as:  DEPO-PROVERA Inject 1 mL (150 mg total) into the muscle every 3 (three) months.      Follow-up Information    [facility] Follow up.   Why:  For contraceptive management as needed Contact information: [address] [place] [address] [phone]       [facility] Follow up.   Specialty:  Behavioral Health Why:  Walk in or call for appointment as needed Contact information: [address] [place] [address] [phone]           Sharen Counter Certified Nurse-Midwife 04/30/2017  8:32 PM

## 2017-04-30 NOTE — MAU Note (Signed)
Is on Amoxicillin for strep throat.  Yesterday felt she was about to faint.  Was getting hot.  Got back home, stomach started hurting. Noted some blood. Took some medicine. Felt better, and bleeding stopped.  Today is having more bleeding, not heavy, just wearing pantiliner. Pain started again.  Feels like she is going to throw up, feels hot "on the inside".  Is on depo, last was in May.  So much going on , doesn't know what is wrong.

## 2017-06-16 ENCOUNTER — Ambulatory Visit: Payer: Medicaid Other

## 2017-06-21 ENCOUNTER — Telehealth: Payer: Self-pay

## 2017-06-21 NOTE — Telephone Encounter (Signed)
Pt called wanting to schedule an appt to discuss birth control. Pt transferred to front staff to schedule appt

## 2017-06-24 ENCOUNTER — Ambulatory Visit (INDEPENDENT_AMBULATORY_CARE_PROVIDER_SITE_OTHER): Payer: Medicaid Other | Admitting: Obstetrics and Gynecology

## 2017-06-24 ENCOUNTER — Encounter: Payer: Self-pay | Admitting: Obstetrics and Gynecology

## 2017-06-24 VITALS — BP 127/80 | HR 97 | Ht 68.0 in | Wt 197.6 lb

## 2017-06-24 DIAGNOSIS — Z3009 Encounter for other general counseling and advice on contraception: Secondary | ICD-10-CM

## 2017-06-24 DIAGNOSIS — Z308 Encounter for other contraceptive management: Secondary | ICD-10-CM

## 2017-06-24 LAB — POCT URINE PREGNANCY: Preg Test, Ur: NEGATIVE

## 2017-06-24 NOTE — Patient Instructions (Signed)
Contraception Choices Contraception (birth control) is the use of any methods or devices to prevent pregnancy. Below are some methods to help avoid pregnancy. Hormonal methods  Contraceptive implant. This is a thin, plastic tube containing progesterone hormone. It does not contain estrogen hormone. Your health care provider inserts the tube in the inner part of the upper arm. The tube can remain in place for up to 3 years. After 3 years, the implant must be removed. The implant prevents the ovaries from releasing an egg (ovulation), thickens the cervical mucus to prevent sperm from entering the uterus, and thins the lining of the inside of the uterus.  Progesterone-only injections. These injections are given every 3 months by your health care provider to prevent pregnancy. This synthetic progesterone hormone stops the ovaries from releasing eggs. It also thickens cervical mucus and changes the uterine lining. This makes it harder for sperm to survive in the uterus.  Birth control pills. These pills contain estrogen and progesterone hormone. They work by preventing the ovaries from releasing eggs (ovulation). They also cause the cervical mucus to thicken, preventing the sperm from entering the uterus. Birth control pills are prescribed by a health care provider.Birth control pills can also be used to treat heavy periods.  Minipill. This type of birth control pill contains only the progesterone hormone. They are taken every day of each month and must be prescribed by your health care provider.  Birth control patch. The patch contains hormones similar to those in birth control pills. It must be changed once a week and is prescribed by a health care provider.  Vaginal ring. The ring contains hormones similar to those in birth control pills. It is left in the vagina for 3 weeks, removed for 1 week, and then a new one is put back in place. The patient must be comfortable inserting and removing the ring from  the vagina.A health care provider's prescription is necessary.  Emergency contraception. Emergency contraceptives prevent pregnancy after unprotected sexual intercourse. This pill can be taken right after sex or up to 5 days after unprotected sex. It is most effective the sooner you take the pills after having sexual intercourse. Most emergency contraceptive pills are available without a prescription. Check with your pharmacist. Do not use emergency contraception as your only form of birth control. Barrier methods  Female condom. This is a thin sheath (latex or rubber) that is worn over the penis during sexual intercourse. It can be used with spermicide to increase effectiveness.  Female condom. This is a soft, loose-fitting sheath that is put into the vagina before sexual intercourse.  Diaphragm. This is a soft, latex, dome-shaped barrier that must be fitted by a health care provider. It is inserted into the vagina, along with a spermicidal jelly. It is inserted before intercourse. The diaphragm should be left in the vagina for 6 to 8 hours after intercourse.  Cervical cap. This is a round, soft, latex or plastic cup that fits over the cervix and must be fitted by a health care provider. The cap can be left in place for up to 48 hours after intercourse.  Sponge. This is a soft, circular piece of polyurethane foam. The sponge has spermicide in it. It is inserted into the vagina after wetting it and before sexual intercourse.  Spermicides. These are chemicals that kill or block sperm from entering the cervix and uterus. They come in the form of creams, jellies, suppositories, foam, or tablets. They do not require a prescription. They   are inserted into the vagina with an applicator before having sexual intercourse. The process must be repeated every time you have sexual intercourse. Intrauterine contraception  Intrauterine device (IUD). This is a T-shaped device that is put in a woman's uterus during  a menstrual period to prevent pregnancy. There are 2 types: ? Copper IUD. This type of IUD is wrapped in copper wire and is placed inside the uterus. Copper makes the uterus and fallopian tubes produce a fluid that kills sperm. It can stay in place for 10 years. ? Hormone IUD. This type of IUD contains the hormone progestin (synthetic progesterone). The hormone thickens the cervical mucus and prevents sperm from entering the uterus, and it also thins the uterine lining to prevent implantation of a fertilized egg. The hormone can weaken or kill the sperm that get into the uterus. It can stay in place for 3-5 years, depending on which type of IUD is used. Permanent methods of contraception  Female tubal ligation. This is when the woman's fallopian tubes are surgically sealed, tied, or blocked to prevent the egg from traveling to the uterus.  Hysteroscopic sterilization. This involves placing a small coil or insert into each fallopian tube. Your doctor uses a technique called hysteroscopy to do the procedure. The device causes scar tissue to form. This results in permanent blockage of the fallopian tubes, so the sperm cannot fertilize the egg. It takes about 3 months after the procedure for the tubes to become blocked. You must use another form of birth control for these 3 months.  Female sterilization. This is when the female has the tubes that carry sperm tied off (vasectomy).This blocks sperm from entering the vagina during sexual intercourse. After the procedure, the man can still ejaculate fluid (semen). Natural planning methods  Natural family planning. This is not having sexual intercourse or using a barrier method (condom, diaphragm, cervical cap) on days the woman could become pregnant.  Calendar method. This is keeping track of the length of each menstrual cycle and identifying when you are fertile.  Ovulation method. This is avoiding sexual intercourse during ovulation.  Symptothermal method.  This is avoiding sexual intercourse during ovulation, using a thermometer and ovulation symptoms.  Post-ovulation method. This is timing sexual intercourse after you have ovulated. Regardless of which type or method of contraception you choose, it is important that you use condoms to protect against the transmission of sexually transmitted infections (STIs). Talk with your health care provider about which form of contraception is most appropriate for you. This information is not intended to replace advice given to you by your health care provider. Make sure you discuss any questions you have with your health care provider. Document Released: 11/09/2005 Document Revised: 04/16/2016 Document Reviewed: 05/04/2013 Elsevier Interactive Patient Education  2017 [employer]  

## 2017-06-24 NOTE — Progress Notes (Signed)
Pt previously on pill and Depo since 11th grade requests something different.Pt unsure what. She c/o depression, mood swings and "hair stopped growing."

## 2017-06-24 NOTE — Progress Notes (Signed)
21 yo G0 here for contraception counseling. Patient has been on depo-provera for the past 5 years. Her last dose was due on 7/25 but she did not take it. She reports some depressive symptoms, weight gain and hair loss which she attributes to the depo-provera. She is not currently sexually active. She desires to stop depo-provera and consider her other contraceptive options. She denies pelvic pain, abnormal discharge or vaginal bleeding. She has been amenorrheic with depo-provera  Past Medical History:  Diagnosis Date  . Hypertension   . Nephritic syndrome   . Nephrotic syndrome    Past Surgical History:  Procedure Laterality Date  . RENAL BIOPSY     Family History  Problem Relation Age of Onset  . Diabetes Paternal Grandfather   . Stroke Maternal Grandmother   . Cancer Maternal Grandfather   . Diabetes Maternal Grandfather   . Hypertension Mother   . Heart disease Mother   . Thyroid disease Mother   ] Social History  Substance Use Topics  . Smoking status: Never Smoker  . Smokeless tobacco: Never Used  . Alcohol use No   ROS See pertinent in HPI  Blood pressure 127/80, pulse 97, height 5\' 8"  (1.727 m), weight 197 lb 9.6 oz (89.6 kg). GENERAL: Well-developed, well-nourished female in no acute distress.  NEURO: alert and oriented x 3  A/P 21 yo here for contraception counseling - Different contraception options discussed with the patient - patient desires to wait for the return of her cycle. She plans on using condoms for now - Advised patient to monitor symptoms and to return if they worsen for evaluation but the reported symptoms are not uncommon with depo-provera - RTC prn for contraction or as needed

## 2017-09-11 ENCOUNTER — Encounter (HOSPITAL_COMMUNITY): Payer: Self-pay | Admitting: Emergency Medicine

## 2017-09-11 DIAGNOSIS — R05 Cough: Secondary | ICD-10-CM | POA: Insufficient documentation

## 2017-09-11 DIAGNOSIS — I129 Hypertensive chronic kidney disease with stage 1 through stage 4 chronic kidney disease, or unspecified chronic kidney disease: Secondary | ICD-10-CM | POA: Insufficient documentation

## 2017-09-11 DIAGNOSIS — R110 Nausea: Secondary | ICD-10-CM | POA: Insufficient documentation

## 2017-09-11 DIAGNOSIS — R0981 Nasal congestion: Secondary | ICD-10-CM | POA: Insufficient documentation

## 2017-09-11 DIAGNOSIS — N183 Chronic kidney disease, stage 3 (moderate): Secondary | ICD-10-CM | POA: Insufficient documentation

## 2017-09-11 DIAGNOSIS — Z79899 Other long term (current) drug therapy: Secondary | ICD-10-CM | POA: Insufficient documentation

## 2017-09-11 LAB — URINALYSIS, ROUTINE W REFLEX MICROSCOPIC
Bacteria, UA: NONE SEEN
Bilirubin Urine: NEGATIVE
GLUCOSE, UA: NEGATIVE mg/dL
HGB URINE DIPSTICK: NEGATIVE
Ketones, ur: NEGATIVE mg/dL
LEUKOCYTES UA: NEGATIVE
NITRITE: NEGATIVE
PROTEIN: 100 mg/dL — AB
Specific Gravity, Urine: 1.016 (ref 1.005–1.030)
pH: 5 (ref 5.0–8.0)

## 2017-09-11 LAB — COMPREHENSIVE METABOLIC PANEL
ALBUMIN: 3.9 g/dL (ref 3.5–5.0)
ALK PHOS: 70 U/L (ref 38–126)
ALT: 34 U/L (ref 14–54)
AST: 36 U/L (ref 15–41)
Anion gap: 9 (ref 5–15)
BILIRUBIN TOTAL: 0.6 mg/dL (ref 0.3–1.2)
BUN: 13 mg/dL (ref 6–20)
CALCIUM: 9.5 mg/dL (ref 8.9–10.3)
CO2: 26 mmol/L (ref 22–32)
Chloride: 104 mmol/L (ref 101–111)
Creatinine, Ser: 1.23 mg/dL — ABNORMAL HIGH (ref 0.44–1.00)
GFR calc Af Amer: >60 mL/min (ref >60)
GFR calc non Af Amer: >60 mL/min (ref >60)
GLUCOSE: 85 mg/dL (ref 65–99)
Potassium: 4 mmol/L (ref 3.5–5.1)
Sodium: 139 mmol/L (ref 135–145)
TOTAL PROTEIN: 7.8 g/dL (ref 6.5–8.1)

## 2017-09-11 LAB — CBC
HEMATOCRIT: 41.4 % (ref 36.0–46.0)
Hemoglobin: 14 g/dL (ref 12.0–15.0)
MCH: 28.6 pg (ref 26.0–34.0)
MCHC: 33.8 g/dL (ref 30.0–36.0)
MCV: 84.5 fL (ref 78.0–100.0)
Platelets: 221 10*3/uL (ref 150–400)
RBC: 4.9 MIL/uL (ref 3.87–5.11)
RDW: 13.8 % (ref 11.5–15.5)
WBC: 7.4 10*3/uL (ref 4.0–10.5)

## 2017-09-11 LAB — LIPASE, BLOOD: Lipase: 39 U/L (ref 11–51)

## 2017-09-11 LAB — POC URINE PREG, ED: Preg Test, Ur: NEGATIVE

## 2017-09-11 MED ORDER — ONDANSETRON 4 MG PO TBDP
4.0000 mg | ORAL_TABLET | Freq: Once | ORAL | Status: AC | PRN
  Administered 2017-09-12: 4 mg via ORAL
  Filled 2017-09-11: qty 1

## 2017-09-11 NOTE — ED Triage Notes (Signed)
Reports having Upper resp symptoms since Wednesday.  Endorses generalized aching, vomiting yesterday.  Hx of nephrotic syndrome.  Denies having any urinary symptoms.

## 2017-09-12 ENCOUNTER — Emergency Department (HOSPITAL_COMMUNITY): Payer: Self-pay

## 2017-09-12 ENCOUNTER — Emergency Department (HOSPITAL_COMMUNITY)
Admission: EM | Admit: 2017-09-12 | Discharge: 2017-09-12 | Disposition: A | Discharge status: Home / Self Care | Payer: Self-pay | Attending: Emergency Medicine | Admitting: Emergency Medicine

## 2017-09-12 DIAGNOSIS — R0981 Nasal congestion: Secondary | ICD-10-CM

## 2017-09-12 DIAGNOSIS — R059 Cough, unspecified: Secondary | ICD-10-CM

## 2017-09-12 DIAGNOSIS — R05 Cough: Secondary | ICD-10-CM

## 2017-09-12 DIAGNOSIS — R110 Nausea: Secondary | ICD-10-CM

## 2017-09-12 NOTE — ED Provider Notes (Signed)
[facility] EMERGENCY DEPARTMENT Provider Note   CSN: [id number] Arrival date & time: 09/11/17  1909     History   Chief Complaint Chief Complaint  Patient presents with  . URI  . Nausea    HPI Kelsey Ramsey is a 21 y.o. female.  The history is provided by the patient and medical records.     21 y.o. F with hx of HTN, nephrotic syndrome, presenting to the ED with multiple complaints.  Patient states she works at [employer] and has been eating their food a lot and thinks all the fried chicken is making her nauseated.  She does have some heartburn type symptoms when eating.  States it never tastes bad when she eats it, just feels nauseated afterwards.  No vomiting, diarrhea, or abdominal pain.  Denies possibility of pregnancy, on depo.  States also recently she has noticed some nasal congestion, cough, and body aches/fatigue.  States she thinks it is because she is not taking her blood pressure medications.  States she was previously on losartan but was switched to labetolol due to the recall.  States she has not been taking it because "my medicaid lapsed and I don't want to pay for it.  It's not that many pills anyway."  She denies dizziness, confusion, numbness, weakness, blurred vision.  No chest pain or SOB.  Does not check her BP at home.  Past Medical History:  Diagnosis Date  . Hypertension   . Nephritic syndrome   . Nephrotic syndrome     Patient Active Problem List   Diagnosis Date Noted  . 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 Nephrotic syndrome, focal and segmental glomerular lesions 04/06/2016  . Renal hypertension 04/06/2016  . Abdominal wall abscess 10/04/2015  . Abdominal pain, left upper quadrant 09/27/2015  . Subcutaneous mass 09/27/2015  . Chronic kidney disease 08/10/2014  . Chronic kidney disease (CKD) stage G3a/A2, moderately decreased glomerular  filtration rate (GFR) between 45-59 mL/min/1.73 square meter and albuminuria creatinine ratio between 30-299 mg/g (HCC) 08/10/2014  . Therapeutic drug monitoring 05/12/2012  . History of TB (tuberculosis) 02/11/2012  . History of tuberculosis 02/11/2012    Past Surgical History:  Procedure Laterality Date  . RENAL BIOPSY      OB History    Gravida Para Term Preterm AB Living   0 0 0 0 0 0   SAB TAB Ectopic Multiple Live Births   0 0 0 0 0       Home Medications    Prior to Admission medications   Medication Sig Start Date End Date Taking? Authorizing Provider  amoxicillin (AMOXIL) 500 MG capsule Take 2 capsules (1,000 mg total) by mouth 2 (two) times daily. Patient not taking: Reported on 06/24/2017 04/18/17   Melene Plan, DO  BIOTIN PO Take 2 capsules by mouth daily. OTC vitamin, pt not sure of strength/name- for hair and nails    [provider]  ibuprofen (ADVIL,MOTRIN) 600 MG tablet Take 1 tablet (600 mg total) by mouth every 6 (six) hours as needed for fever or headache. Patient not taking: Reported on 06/24/2017 04/30/17   Sharen Counter A, CNM  labetalol (NORMODYNE) 200 MG tablet Take 200 mg by mouth 2 (two) times daily.    [provider]  losartan (COZAAR) 100 MG tablet Take 100 mg by mouth daily.  03/17/16   [provider]  medroxyPROGESTERone (DEPO-PROVERA) 150 MG/ML injection  Inject 1 mL (150 mg total) into the muscle every 3 (three) months. Patient not taking: Reported on 06/24/2017 07/02/16   Brock BadHarper, Charles A, MD    Family History Family History  Problem Relation Age of Onset  . Diabetes Paternal Grandfather   . Stroke Maternal Grandmother   . Cancer Maternal Grandfather   . Diabetes Maternal Grandfather   . Hypertension Mother   . Heart disease Mother   . Thyroid disease Mother     Social History Social History  Substance Use Topics  . Smoking status: Never Smoker  . Smokeless tobacco: Never Used  . Alcohol use No      Allergies   Pineapple   Review of Systems Review of Systems  Constitutional: Positive for fatigue.  HENT: Positive for congestion.   Respiratory: Positive for cough.   All other systems reviewed and are negative.    Physical Exam Updated Vital Signs BP 127/71 (BP Location: Left Arm)   Pulse (!) 104   Temp 99.2 F (37.3 C) (Oral)   Resp 18   Ht 5\' 8"  (1.727 m)   Wt 96.2 kg (212 lb)   SpO2 97%   BMI 32.23 kg/m   Physical Exam  Constitutional: She is oriented to person, place, and time. She appears well-developed and well-nourished.  HENT:  Head: Normocephalic and atraumatic.  Right Ear: Tympanic membrane and ear canal normal.  Left Ear: Tympanic membrane and ear canal normal.  Nose: Nose normal. No mucosal edema or rhinorrhea.  Mouth/Throat: Uvula is midline, oropharynx is clear and moist and mucous membranes are normal.  Tonsils overall normal in appearance bilaterally without exudate; uvula midline without evidence of peritonsillar abscess; handling secretions appropriately; no difficulty swallowing or speaking; normal phonation without stridor  Eyes: Pupils are equal, round, and reactive to light. Conjunctivae and EOM are normal.  Neck: Normal range of motion.  Cardiovascular: Normal rate, regular rhythm and normal heart sounds.   Pulmonary/Chest: Effort normal and breath sounds normal. No respiratory distress. She has no wheezes.  Abdominal: Soft. Bowel sounds are normal. There is no tenderness. There is no rebound.  Musculoskeletal: Normal range of motion.  Neurological: She is alert and oriented to person, place, and time.  Skin: Skin is warm and dry.  Psychiatric: She has a normal mood and affect.  Nursing note and vitals reviewed.    ED Treatments / Results  Labs (all labs ordered are listed, but only abnormal results are displayed) Labs Reviewed  COMPREHENSIVE METABOLIC PANEL - Abnormal; Notable for the following:       Result Value   Creatinine,  Ser 1.23 (*)    All other components within normal limits  URINALYSIS, ROUTINE W REFLEX MICROSCOPIC - Abnormal; Notable for the following:    APPearance HAZY (*)    Protein, ur 100 (*)    Squamous Epithelial / LPF 6-30 (*)    All other components within normal limits  LIPASE, BLOOD  CBC  POC URINE PREG, ED    EKG  EKG Interpretation None       Radiology Dg Chest 2 View  Result Date: 09/12/2017 CLINICAL DATA:  Upper respiratory symptoms since Wednesday. Cough. Generalized aching and vomiting. EXAM: CHEST  2 VIEW COMPARISON:  None. FINDINGS: The heart size and mediastinal contours are within normal limits. Both lungs are clear. The visualized skeletal structures are unremarkable. IMPRESSION: No active cardiopulmonary disease. Electronically Signed   By: Burman NievesWilliam  Stevens M.D.   On: 09/12/2017 01:55    Procedures Procedures (  including critical care time)  Medications Ordered in ED Medications  ondansetron (ZOFRAN-ODT) disintegrating tablet 4 mg (4 mg Oral Given 09/12/17 0158)     Initial Impression / Assessment and Plan / ED Course  I have reviewed the triage vital signs and the nursing notes.  Pertinent labs & imaging results that were available during my care of the patient were reviewed by me and considered in my medical decision making (see chart for details).  21 year old female here with 2 complaints.  1.  Nausea. States mostly after eating. She works at [employer] and has been eating their food nearly every day. Also reports some heartburn type symptoms. No abdominal pain, vomiting, or diarrhea. Abdomen is soft and benign here. Pregnancy test is negative. Screening labs overall reassuring.  Hx nephrotic syndrome, creatinine around her baseline.  Recommended diet modification, can try Zantac before eating to help with symptoms. Offered Zofran, however patient does not want to pay for this.  2.  URI type symptoms. Reports nasal congestion and cough. No chest pain or shortness of  breath. She is afebrile, nontoxic.  No significant findings on exam. Again, screening labs overall reassuring. Chest x-ray was added after my initial assessment, no acute findings. Suspect these symptoms may be viral in nature.   Patient also reports she has not been taking her blood pressure medications as directed, discussed this with her. Her blood pressure is mildly elevated here but I do not appreciate any signs of end organ damage.  We'll have her follow-up closely with her primary care doctor.  Discussed plan with patient, he/she acknowledged understanding and agreed with plan of care.  Return precautions given for new or worsening symptoms.  Final Clinical Impressions(s) / ED Diagnoses   Final diagnoses:  Nausea  Cough  Nasal congestion    New Prescriptions Discharge Medication List as of 09/12/2017  2:52 AM       Garlon Hatchet, PA-C 09/12/17 1610    Devoria Albe, MD 09/12/17 [phone]

## 2017-09-12 NOTE — Discharge Instructions (Signed)
If you continue having nausea/heartburn symptoms when eating, would try zantac/ranitidine prior to eating.  Try to limit fatty/acidic foods as this can make it worse. Keep taking your blood pressure medication. Follow-up with your primary care doctor. Return here for any new/worsening symptoms.

## 2018-04-29 ENCOUNTER — Encounter (HOSPITAL_COMMUNITY): Payer: Self-pay

## 2018-04-29 ENCOUNTER — Emergency Department (HOSPITAL_COMMUNITY)
Admission: EM | Admit: 2018-04-29 | Discharge: 2018-04-29 | Disposition: A | Discharge status: Home / Self Care | Payer: Medicaid Other | Attending: Emergency Medicine | Admitting: Emergency Medicine

## 2018-04-29 ENCOUNTER — Other Ambulatory Visit: Payer: Self-pay

## 2018-04-29 DIAGNOSIS — I129 Hypertensive chronic kidney disease with stage 1 through stage 4 chronic kidney disease, or unspecified chronic kidney disease: Secondary | ICD-10-CM | POA: Insufficient documentation

## 2018-04-29 DIAGNOSIS — Z79899 Other long term (current) drug therapy: Secondary | ICD-10-CM | POA: Insufficient documentation

## 2018-04-29 DIAGNOSIS — N189 Chronic kidney disease, unspecified: Secondary | ICD-10-CM | POA: Insufficient documentation

## 2018-04-29 DIAGNOSIS — J029 Acute pharyngitis, unspecified: Secondary | ICD-10-CM | POA: Insufficient documentation

## 2018-04-29 LAB — GROUP A STREP BY PCR: Group A Strep by PCR: NOT DETECTED

## 2018-04-29 MED ORDER — CHLORHEXIDINE GLUCONATE 0.12 % MT SOLN: 15.0000 mL | Freq: Two times a day (BID) | OROMUCOSAL | 0 refills | Status: DC

## 2018-04-29 NOTE — ED Triage Notes (Signed)
Pt endorses sore throat x 2 weeks with dry cough. VSS. Afebrile.

## 2018-04-29 NOTE — ED Notes (Signed)
ED Provider at bedside. 

## 2018-04-29 NOTE — ED Provider Notes (Signed)
[facility] Provider Note   CSN: [id number] Arrival date & time: 04/29/18  1807     History   Chief Complaint Chief Complaint  Patient presents with  . Sore Throat    HPI Kelsey Ramsey is a 22 y.o. female.  HPI   22 year old female with history of kidney disease  complaining of sore throat.  For the past 2 weeks patient has report having throat irritation primarily to the right side of the throat.  She also endorsed occasional dry cough, sinus congestion.  Symptoms waxing and waning.  No associated fever, headache, voice changes, inability to swallow, chest pain or shortness of breath.  No significant neck discomfort.  No recent sick contact.  No abdominal pain.  No significant treatment tried at home.  Past Medical History:  Diagnosis Date  . Hypertension   . Nephritic syndrome   . Nephrotic syndrome     Patient Active Problem List   Diagnosis Date Noted  . 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 Nephrotic syndrome, focal and segmental glomerular lesions 04/06/2016  . Renal hypertension 04/06/2016  . Abdominal wall abscess 10/04/2015  . Abdominal pain, left upper quadrant 09/27/2015  . Subcutaneous mass 09/27/2015  . Chronic kidney disease 08/10/2014  . Chronic kidney disease (CKD) stage G3a/A2, moderately decreased glomerular filtration rate (GFR) between 45-59 mL/min/1.73 square meter and albuminuria creatinine ratio between 30-299 mg/g (HCC) 08/10/2014  . Therapeutic drug monitoring 05/12/2012  . History of TB (tuberculosis) 02/11/2012  . History of tuberculosis 02/11/2012    Past Surgical History:  Procedure Laterality Date  . RENAL BIOPSY       OB History    Gravida  0   Para  0   Term  0   Preterm  0   AB  0   Living  0     SAB  0   TAB  0   Ectopic  0   Multiple  0   Live Births  0            Home  Medications    Prior to Admission medications   Medication Sig Start Date End Date Taking? Authorizing Provider  amoxicillin (AMOXIL) 500 MG capsule Take 2 capsules (1,000 mg total) by mouth 2 (two) times daily. Patient not taking: Reported on 06/24/2017 04/18/17   Melene PlanFloyd, Dan, DO  ibuprofen (ADVIL,MOTRIN) 600 MG tablet Take 1 tablet (600 mg total) by mouth every 6 (six) hours as needed for fever or headache. Patient not taking: Reported on 06/24/2017 04/30/17   Sharen CounterLeftwich-Kirby, Lisa A, CNM  labetalol (NORMODYNE) 200 MG tablet Take 200 mg by mouth 2 (two) times daily.    [provider]  medroxyPROGESTERone (DEPO-PROVERA) 150 MG/ML injection Inject 1 mL (150 mg total) into the muscle every 3 (three) months. Patient not taking: Reported on 06/24/2017 07/02/16   Brock BadHarper, Charles A, MD    Family History Family History  Problem Relation Age of Onset  . Diabetes Paternal Grandfather   . Stroke Maternal Grandmother   . Cancer Maternal Grandfather   . Diabetes Maternal Grandfather   . Hypertension Mother   . Heart disease Mother   . Thyroid disease Mother     Social History Social History   Tobacco Use  . Smoking status: Never Smoker  . Smokeless tobacco: Never Used  Substance Use Topics  . Alcohol use: No  . Drug use:  No     Allergies   Pineapple   Review of Systems Review of Systems  Constitutional: Negative for fever.  HENT: Positive for sore throat. Negative for trouble swallowing and voice change.   Respiratory: Negative for shortness of breath.   Cardiovascular: Negative for chest pain.  Gastrointestinal: Negative for abdominal pain.     Physical Exam Updated Vital Signs BP 133/89 (BP Location: Right Arm)   Pulse 90   Temp 98.5 F (36.9 C) (Oral)   Resp 16   Ht 5\' 8"  (1.727 m)   Wt 107 kg (236 lb)   LMP 04/18/2018 (Approximate)   SpO2 100%   BMI 35.88 kg/m   Physical Exam  Constitutional: She appears well-developed and well-nourished. No distress.  HENT:    Head: Atraumatic.  Mouth/Throat: Uvula is midline.  Ears: Right TM normal appearance, left TM is obscured by cerumen Nose: Mild rhinorrhea Throat: Uvula midline mild posterior oropharyngeal erythema without exudates, no trismus, no tongue swelling.  Eyes: Conjunctivae are normal.  Neck: Neck supple.  Cardiovascular: Normal rate and regular rhythm.  Pulmonary/Chest: Effort normal and breath sounds normal.  Abdominal: Soft. There is no tenderness.  Neurological: She is alert.  Skin: No rash noted.  Psychiatric: She has a normal mood and affect.  Nursing note and vitals reviewed.    ED Treatments / Results  Labs (all labs ordered are listed, but only abnormal results are displayed) Labs Reviewed  GROUP A STREP BY PCR    EKG None  Radiology No results found.  Procedures Procedures (including critical care time)  Medications Ordered in ED Medications - No data to display   Initial Impression / Assessment and Plan / ED Course  I have reviewed the triage vital signs and the nursing notes.  Pertinent labs & imaging results that were available during my care of the patient were reviewed by me and considered in my medical decision making (see chart for details).     BP 133/89 (BP Location: Right Arm)   Pulse 90   Temp 98.5 F (36.9 C) (Oral)   Resp 16   Ht 5\' 8"  (1.727 m)   Wt 107 kg (236 lb)   LMP 04/18/2018 (Approximate)   SpO2 100%   BMI 35.88 kg/m    Final Clinical Impressions(s) / ED Diagnoses   Final diagnoses:  Viral pharyngitis    ED Discharge Orders    None     8:16 PM Patient complains of sore throat for several weeks.  Strep Test negative.  On exam, mild posterior oropharyngeal erythema but no evidence concerning for deep tissue infection.  She has normal phonation, tolerating secretions without difficulty.  Recommend gargle with salt water, and return precautions discussed.  Suspect viral etiology.   Fayrene Helper, PA-C 04/29/18 2019     Tegeler, Canary Brim, MD 04/30/18 0201

## 2018-09-20 ENCOUNTER — Other Ambulatory Visit (HOSPITAL_COMMUNITY)
Admission: RE | Admit: 2018-09-20 | Discharge: 2018-09-20 | Disposition: A | Discharge status: Home / Self Care | Payer: Medicaid Other | Source: Ambulatory Visit | Attending: Obstetrics and Gynecology | Admitting: Obstetrics and Gynecology

## 2018-09-20 ENCOUNTER — Ambulatory Visit (INDEPENDENT_AMBULATORY_CARE_PROVIDER_SITE_OTHER): Payer: Medicaid Other | Admitting: Obstetrics and Gynecology

## 2018-09-20 ENCOUNTER — Encounter: Payer: Self-pay | Admitting: Obstetrics and Gynecology

## 2018-09-20 VITALS — BP 137/86 | HR 98 | Wt 239.3 lb

## 2018-09-20 DIAGNOSIS — Z01419 Encounter for gynecological examination (general) (routine) without abnormal findings: Secondary | ICD-10-CM | POA: Insufficient documentation

## 2018-09-20 DIAGNOSIS — Z0000 Encounter for general adult medical examination without abnormal findings: Secondary | ICD-10-CM | POA: Diagnosis not present

## 2018-09-20 NOTE — Progress Notes (Signed)
Subjective:     Kelsey Ramsey is a 22 y.o. female P0 with BMI 36 and LMP 09/10/2018 who is here for a comprehensive physical exam. The patient reports no problems. Patient is not sexually active. She reports a monthly period lasting 4-5 days. She denies any pelvic pain or abnormal discharge. She is not currently in a relationship but desires to start birth control. She was previously on depo-provera and is now interested in LARC.  Past Medical History:  Diagnosis Date  . Hypertension   . Nephritic syndrome   . Nephrotic syndrome    Past Surgical History:  Procedure Laterality Date  . RENAL BIOPSY     Family History  Problem Relation Age of Onset  . Diabetes Paternal Grandfather   . Stroke Maternal Grandmother   . Cancer Maternal Grandfather   . Diabetes Maternal Grandfather   . Hypertension Mother   . Heart disease Mother   . Thyroid disease Mother     Social History   Socioeconomic History  . Marital status: Single    Spouse name: Not on file  . Number of children: Not on file  . Years of education: Not on file  . Highest education level: Not on file  Occupational History  . Not on file  Social Needs  . Financial resource strain: Not on file  . Food insecurity:    Worry: Not on file    Inability: Not on file  . Transportation needs:    Medical: Not on file    Non-medical: Not on file  Tobacco Use  . Smoking status: Never Smoker  . Smokeless tobacco: Never Used  Substance and Sexual Activity  . Alcohol use: No  . Drug use: No  . Sexual activity: Not Currently    Birth control/protection: None  Lifestyle  . Physical activity:    Days per week: Not on file    Minutes per session: Not on file  . Stress: Not on file  Relationships  . Social connections:    Talks on phone: Not on file    Gets together: Not on file    Attends religious service: Not on file    Active member of club or organization: Not on file    Attends meetings of clubs or organizations: Not  on file    Relationship status: Not on file  . Intimate partner violence:    Fear of current or ex partner: Not on file    Emotionally abused: Not on file    Physically abused: Not on file    Forced sexual activity: Not on file  Other Topics Concern  . Not on file  Social History Narrative  . Not on file   Health Maintenance  Topic Date Due  . TETANUS/TDAP  06/16/2017  . INFLUENZA VACCINE  06/23/2018  . HIV Screening  Completed      Review of Systems Pertinent items are noted in HPI.   Objective:  Blood pressure 137/86, pulse 98, weight 239 lb 4.8 oz (108.5 kg), last menstrual period 09/10/2018.      GENERAL: Well-developed, well-nourished female in no acute distress.  HEENT: Normocephalic, atraumatic. Sclerae anicteric.  NECK: Supple. Normal thyroid.  LUNGS: Clear to auscultation bilaterally.  HEART: Regular rate and rhythm. BREASTS: Symmetric in size. No palpable masses or lymphadenopathy, skin changes, or nipple drainage. ABDOMEN: Soft, nontender, nondistended. No organomegaly. EXTREMITIES: No cyanosis, clubbing, or edema, 2+ distal pulses.    Assessment:    Healthy female exam.  Plan:    Pap smear next year STI screening per patient request Discussed contraception options with the patient. Patient remains undecided at this time.  Patient will return after doing some research See After Visit Summary for Counseling Recommendations

## 2018-09-20 NOTE — Progress Notes (Signed)
Presents for [employer].

## 2018-09-21 LAB — HEPATITIS B SURFACE ANTIGEN: HEP B S AG: NEGATIVE

## 2018-09-22 LAB — HEPATITIS C ANTIBODY: Hep C Virus Ab: <0.1 s/co ratio (ref 0.0–0.9)

## 2018-09-22 LAB — CERVICOVAGINAL ANCILLARY ONLY
CHLAMYDIA, DNA PROBE: NEGATIVE
NEISSERIA GONORRHEA: POSITIVE — AB
Trichomonas: POSITIVE — AB

## 2018-09-22 LAB — RPR: RPR Ser Ql: NONREACTIVE

## 2018-09-22 LAB — HIV ANTIBODY (ROUTINE TESTING W REFLEX): HIV SCREEN 4TH GENERATION: NONREACTIVE

## 2018-09-24 MED ORDER — AZITHROMYCIN 500 MG PO TABS: 1000.0000 mg | ORAL_TABLET | Freq: Once | ORAL | 1 refills | Status: AC

## 2018-09-24 MED ORDER — METRONIDAZOLE 500 MG PO TABS: 500.0000 mg | ORAL_TABLET | Freq: Two times a day (BID) | ORAL | 0 refills | Status: DC

## 2018-09-24 MED ORDER — CEFTRIAXONE SODIUM 250 MG IJ SOLR: 250.0000 mg | Freq: Once | INTRAMUSCULAR | Status: DC

## 2018-09-24 NOTE — Addendum Note (Signed)
Addended by: Catalina Antigua on: 09/24/2018 12:00 PM   Modules accepted: Orders

## 2018-09-26 ENCOUNTER — Telehealth: Payer: Self-pay | Admitting: *Deleted

## 2018-09-26 ENCOUNTER — Telehealth: Payer: Self-pay

## 2018-09-26 ENCOUNTER — Ambulatory Visit: Payer: Medicaid Other

## 2018-09-26 NOTE — Telephone Encounter (Signed)
TC from pt regarding results seen in Mychart over the weekend Pt notified and made aware Rx was sent and pt info given to scheduling to make nurse visit for Rocephin injection.

## 2018-09-27 ENCOUNTER — Ambulatory Visit: Payer: Self-pay

## 2018-09-27 ENCOUNTER — Ambulatory Visit (INDEPENDENT_AMBULATORY_CARE_PROVIDER_SITE_OTHER): Payer: Medicaid Other

## 2018-09-27 DIAGNOSIS — A549 Gonococcal infection, unspecified: Secondary | ICD-10-CM | POA: Diagnosis not present

## 2018-09-27 MED ORDER — CEFTRIAXONE SODIUM 250 MG IJ SOLR
250.0000 mg | Freq: Once | INTRAMUSCULAR | Status: AC
  Administered 2018-09-27: 250 mg via INTRAMUSCULAR

## 2018-09-27 NOTE — Progress Notes (Signed)
Preesents for Rocephin Injection, given in RUOQ, tolerated well.  TOC in 3 months.  Administrations This Visit    cefTRIAXone (ROCEPHIN) injection 250 mg    Admin Date 09/27/2018 Action Given Dose 250 mg Route Intramuscular Administered By Maretta Bees, RMA

## 2018-12-13 ENCOUNTER — Other Ambulatory Visit: Payer: Self-pay | Admitting: Obstetrics & Gynecology

## 2018-12-13 ENCOUNTER — Encounter: Payer: Self-pay | Admitting: Obstetrics & Gynecology

## 2018-12-13 ENCOUNTER — Ambulatory Visit: Payer: Self-pay | Admitting: Obstetrics and Gynecology

## 2018-12-13 ENCOUNTER — Other Ambulatory Visit (HOSPITAL_COMMUNITY)
Admission: RE | Admit: 2018-12-13 | Discharge: 2018-12-13 | Disposition: A | Discharge status: Home / Self Care | Payer: Medicaid Other | Source: Ambulatory Visit | Attending: Obstetrics & Gynecology | Admitting: Obstetrics & Gynecology

## 2018-12-13 ENCOUNTER — Ambulatory Visit (INDEPENDENT_AMBULATORY_CARE_PROVIDER_SITE_OTHER): Payer: Self-pay | Admitting: Obstetrics & Gynecology

## 2018-12-13 VITALS — BP 157/86 | HR 84 | Resp 18 | Ht 67.5 in | Wt 242.3 lb

## 2018-12-13 DIAGNOSIS — A549 Gonococcal infection, unspecified: Secondary | ICD-10-CM | POA: Insufficient documentation

## 2018-12-13 DIAGNOSIS — A599 Trichomoniasis, unspecified: Secondary | ICD-10-CM | POA: Diagnosis present

## 2018-12-13 DIAGNOSIS — O99210 Obesity complicating pregnancy, unspecified trimester: Secondary | ICD-10-CM | POA: Insufficient documentation

## 2018-12-13 DIAGNOSIS — N912 Amenorrhea, unspecified: Secondary | ICD-10-CM

## 2018-12-13 DIAGNOSIS — N939 Abnormal uterine and vaginal bleeding, unspecified: Secondary | ICD-10-CM

## 2018-12-13 LAB — POCT URINE PREGNANCY: PREG TEST UR: NEGATIVE

## 2018-12-13 MED ORDER — MEDROXYPROGESTERONE ACETATE 10 MG PO TABS: 10.0000 mg | ORAL_TABLET | Freq: Every day | ORAL | 2 refills | Status: DC

## 2018-12-13 NOTE — Progress Notes (Signed)
   Subjective:    Patient ID: Kelsey Ramsey, female    DOB: Mar 25, 1996, 23 y.o.   MRN: [id number]  HPI 23 yo single P0 here with the complaint of not having a period since 10/19. She has had a new partner since then, does not use contraception. Prior to this, she reports normal monthly periods. She says that she does not want a pregnancy at this time.  Review of Systems She used depo provera for 5 years in the past.    Objective:   Physical Exam Breathing, conversing, and ambulating normally Well nourished, well hydrated Black female, no apparent distress Abd- benign Speculum exam- no abnormal findings      Assessment & Plan:  + GC and trich on 09/20/18- TOC today Missed period for 3 months- rec trial of provera Come back 2 weeks for contraception- she declines contraception today, really wants to have a period I have stressed that she should use condoms until her next visit. I have rec'd healthy lifestyle choices amenorrhea Rec condom use

## 2018-12-14 LAB — TSH: TSH: 1.22 u[IU]/mL (ref 0.450–4.500)

## 2018-12-14 LAB — CERVICOVAGINAL ANCILLARY ONLY
Bacterial vaginitis: NEGATIVE
Candida vaginitis: NEGATIVE
Chlamydia: NEGATIVE
Neisseria Gonorrhea: NEGATIVE
Trichomonas: NEGATIVE

## 2018-12-28 ENCOUNTER — Ambulatory Visit (INDEPENDENT_AMBULATORY_CARE_PROVIDER_SITE_OTHER): Payer: Medicaid Other | Admitting: Obstetrics and Gynecology

## 2018-12-28 ENCOUNTER — Encounter: Payer: Self-pay | Admitting: Obstetrics and Gynecology

## 2018-12-28 DIAGNOSIS — Z30013 Encounter for initial prescription of injectable contraceptive: Secondary | ICD-10-CM | POA: Diagnosis not present

## 2018-12-28 DIAGNOSIS — Z3202 Encounter for pregnancy test, result negative: Secondary | ICD-10-CM

## 2018-12-28 DIAGNOSIS — Z309 Encounter for contraceptive management, unspecified: Secondary | ICD-10-CM | POA: Insufficient documentation

## 2018-12-28 LAB — POCT URINE PREGNANCY: Preg Test, Ur: NEGATIVE

## 2018-12-28 MED ORDER — MEDROXYPROGESTERONE ACETATE 150 MG/ML IM SUSP: 150.0000 mg | INTRAMUSCULAR | 3 refills | Status: DC

## 2018-12-28 MED ORDER — MEDROXYPROGESTERONE ACETATE 150 MG/ML IM SUSP: 150.0000 mg | INTRAMUSCULAR | 0 refills | Status: DC

## 2018-12-28 MED ORDER — MEDROXYPROGESTERONE ACETATE 150 MG/ML IM SUSP
150.0000 mg | INTRAMUSCULAR | Status: DC
  Administered 2018-12-28: 150 mg via INTRAMUSCULAR

## 2018-12-28 NOTE — Progress Notes (Signed)
Patient ID: Kelsey Ramsey, female   DOB: 15-Sep-1996, 23 y.o.   MRN: [id number] [name] is here for follow up from Provera challenge. Start cycle yesterday. UPT today negative. Has been on Depo Provera in the past Desires to restart  H/O HTN and CKD, no PCP  PE AF VSS Lungs clear Heart RRR Abd soft + BS  A/P Contraception Management.        HTN/CKD  Will start Depo Provera today. Rx to pharmacy, first injection in office today. F/U q 12 weeks. Information on PCP provided to pt for management of HTN and CKD.

## 2018-12-28 NOTE — Progress Notes (Signed)
Pt is in the office for depo, reports that she only has [facility] and it does not cover BP med. Pt was taking Provera to start cycle, LMP 12-27-18. Administered depo and pt tolerated well .Marland Kitchen Administrations This Visit    medroxyPROGESTERone (DEPO-PROVERA) injection 150 mg    Admin Date 12/28/2018 Action Given Dose 150 mg Route Intramuscular Administered By Katrina Stack, RN

## 2018-12-28 NOTE — Patient Instructions (Signed)

## 2019-03-17 ENCOUNTER — Ambulatory Visit: Payer: Medicaid Other

## 2019-03-28 ENCOUNTER — Other Ambulatory Visit: Payer: Self-pay

## 2019-03-28 ENCOUNTER — Telehealth: Payer: Self-pay

## 2019-03-28 ENCOUNTER — Encounter (HOSPITAL_COMMUNITY): Payer: Self-pay | Admitting: Student

## 2019-03-28 ENCOUNTER — Emergency Department (HOSPITAL_COMMUNITY)
Admission: EM | Admit: 2019-03-28 | Discharge: 2019-03-28 | Disposition: A | Discharge status: Home / Self Care | Payer: Medicaid Other | Attending: Emergency Medicine | Admitting: Emergency Medicine

## 2019-03-28 DIAGNOSIS — K529 Noninfective gastroenteritis and colitis, unspecified: Secondary | ICD-10-CM | POA: Insufficient documentation

## 2019-03-28 DIAGNOSIS — N183 Chronic kidney disease, stage 3 (moderate): Secondary | ICD-10-CM | POA: Insufficient documentation

## 2019-03-28 DIAGNOSIS — I129 Hypertensive chronic kidney disease with stage 1 through stage 4 chronic kidney disease, or unspecified chronic kidney disease: Secondary | ICD-10-CM | POA: Insufficient documentation

## 2019-03-28 DIAGNOSIS — Z79899 Other long term (current) drug therapy: Secondary | ICD-10-CM | POA: Insufficient documentation

## 2019-03-28 DIAGNOSIS — N939 Abnormal uterine and vaginal bleeding, unspecified: Secondary | ICD-10-CM | POA: Insufficient documentation

## 2019-03-28 LAB — URINALYSIS, ROUTINE W REFLEX MICROSCOPIC
Bilirubin Urine: NEGATIVE
Glucose, UA: NEGATIVE mg/dL
Hgb urine dipstick: NEGATIVE
Ketones, ur: NEGATIVE mg/dL
Leukocytes,Ua: NEGATIVE
Nitrite: NEGATIVE
Protein, ur: NEGATIVE mg/dL
Specific Gravity, Urine: 1.015 (ref 1.005–1.030)
pH: 5 (ref 5.0–8.0)

## 2019-03-28 LAB — COMPREHENSIVE METABOLIC PANEL
ALT: 26 U/L (ref 0–44)
AST: 25 U/L (ref 15–41)
Albumin: 3.7 g/dL (ref 3.5–5.0)
Alkaline Phosphatase: 68 U/L (ref 38–126)
Anion gap: 8 (ref 5–15)
BUN: 9 mg/dL (ref 6–20)
CO2: 26 mmol/L (ref 22–32)
Calcium: 9.4 mg/dL (ref 8.9–10.3)
Chloride: 106 mmol/L (ref 98–111)
Creatinine, Ser: 1.1 mg/dL — ABNORMAL HIGH (ref 0.44–1.00)
GFR calc Af Amer: >60 mL/min (ref >60)
GFR calc non Af Amer: >60 mL/min (ref >60)
Glucose, Bld: 92 mg/dL (ref 70–99)
Potassium: 4.1 mmol/L (ref 3.5–5.1)
Sodium: 140 mmol/L (ref 135–145)
Total Bilirubin: 0.6 mg/dL (ref 0.3–1.2)
Total Protein: 7.6 g/dL (ref 6.5–8.1)

## 2019-03-28 LAB — CBC WITH DIFFERENTIAL/PLATELET
Abs Immature Granulocytes: 0.02 10*3/uL (ref 0.00–0.07)
Basophils Absolute: 0 10*3/uL (ref 0.0–0.1)
Basophils Relative: 0 %
Eosinophils Absolute: 0.1 10*3/uL (ref 0.0–0.5)
Eosinophils Relative: 1 %
HCT: 45.9 % (ref 36.0–46.0)
Hemoglobin: 15.2 g/dL — ABNORMAL HIGH (ref 12.0–15.0)
Immature Granulocytes: 0 %
Lymphocytes Relative: 34 %
Lymphs Abs: 2.4 10*3/uL (ref 0.7–4.0)
MCH: 28.7 pg (ref 26.0–34.0)
MCHC: 33.1 g/dL (ref 30.0–36.0)
MCV: 86.8 fL (ref 80.0–100.0)
Monocytes Absolute: 0.5 10*3/uL (ref 0.1–1.0)
Monocytes Relative: 7 %
Neutro Abs: 4 10*3/uL (ref 1.7–7.7)
Neutrophils Relative %: 58 %
Platelets: 195 10*3/uL (ref 150–400)
RBC: 5.29 MIL/uL — ABNORMAL HIGH (ref 3.87–5.11)
RDW: 15.2 % (ref 11.5–15.5)
WBC: 6.9 10*3/uL (ref 4.0–10.5)
nRBC: 0 % (ref 0.0–0.2)

## 2019-03-28 LAB — RAPID URINE DRUG SCREEN, HOSP PERFORMED
Amphetamines: NOT DETECTED
Barbiturates: NOT DETECTED
Benzodiazepines: NOT DETECTED
Cocaine: NOT DETECTED
Opiates: NOT DETECTED
Tetrahydrocannabinol: POSITIVE — AB

## 2019-03-28 LAB — POC URINE PREG, ED: Preg Test, Ur: NEGATIVE

## 2019-03-28 LAB — ETHANOL: Alcohol, Ethyl (B): <10 mg/dL (ref <10)

## 2019-03-28 LAB — LIPASE, BLOOD: Lipase: 41 U/L (ref 11–51)

## 2019-03-28 MED ORDER — OXYCODONE-ACETAMINOPHEN 5-325 MG PO TABS
1.0000 | ORAL_TABLET | Freq: Once | ORAL | Status: AC
  Administered 2019-03-28: 1 via ORAL
  Filled 2019-03-28: qty 1

## 2019-03-28 MED ORDER — LOPERAMIDE HCL 2 MG PO CAPS: 2.0000 mg | ORAL_CAPSULE | Freq: Four times a day (QID) | ORAL | 0 refills | Status: DC | PRN

## 2019-03-28 MED ORDER — ONDANSETRON HCL 4 MG PO TABS: 4.0000 mg | ORAL_TABLET | Freq: Four times a day (QID) | ORAL | 0 refills | Status: DC

## 2019-03-28 NOTE — BH Assessment (Addendum)
Tele Assessment Note   Patient Name: Kelsey Ramsey MRN: [id number] Referring Physician: PA Petrucelli Location of Patient: [facility] Location of Provider: Behavioral Health TTS Department  Kelsey Ramsey is an 23 y.o. female.  The pt came to the emergency room for medical reasons and the pt expressed extreme sadness.  The pt stated she isn't sure why she feels sad, but she thinks it may be because her grandmother died a year ago and a bad relationship a year ago.  She denies SI and any past suicide attempt. She has a couple of cousins and an aunt who attempted suicide.  She saw a counselor when she was in middle school after she was diagnosed with a kidney disease.  The pt has not been inpatient in the past.  The pt lives with her mother and stated they have a good relationship.  She denies self harm, HI, legal issues, history of abuse and hallucinations.  She has a poor appetite. T he pt reports feeling depressed and being tearful.  The pt denies SA.  Pt is dressed in scrubs. She is alert and oriented x4. Pt speaks in a clear tone, at moderate volume and normal pace. Eye contact is good. Pt's mood is depressed. Thought process is coherent and relevant. There is no indication Pt is currently responding to internal stimuli or experiencing delusional thought content.?Pt was cooperative throughout assessment.    Diagnosis: F33.2 Major depressive disorder, Recurrent episode, Severe  Past Medical History:  Past Medical History:  Diagnosis Date  . Hypertension   . Nephritic syndrome   . Nephrotic syndrome     Past Surgical History:  Procedure Laterality Date  . RENAL BIOPSY      Family History:  Family History  Problem Relation Age of Onset  . Diabetes Paternal Grandfather   . Stroke Maternal Grandmother   . Cancer Maternal Grandfather   . Diabetes Maternal Grandfather   . Hypertension Mother   . Heart disease Mother   . Thyroid disease Mother     Social History:  reports that she  has never smoked. She has never used smokeless tobacco. She reports that she does not drink alcohol or use drugs.  Additional Social History:  Alcohol / Drug Use Pain Medications: See MAR Prescriptions: See MAR Over the Counter: See MAR History of alcohol / drug use?: No history of alcohol / drug abuse Longest period of sobriety (when/how long): NA  CIWA: CIWA-Ar BP: (!) 156/79 Pulse Rate: 83 COWS:    Allergies:  Allergies  Allergen Reactions  . Pineapple Itching and Swelling    Home Medications: (Not in a hospital admission)   OB/GYN Status:  Patient's last menstrual period was 01/26/2019 (approximate).  General Assessment Data Location of Assessment: [facility] ED TTS Assessment: In system Is this a Tele or Face-to-Face Assessment?: Face-to-Face Is this an Initial Assessment or a Re-assessment for this encounter?: Initial Assessment Patient Accompanied by:: N/A Language Other than English: No Living Arrangements: Other (Comment)(Home) What gender do you identify as?: Female Marital status: Single Living Arrangements: Parent Can pt return to current living arrangement?: No Admission Status: Voluntary Is patient capable of signing voluntary admission?: No Referral Source: Self/Family/Friend Insurance type: Self pay     Crisis Care Plan Living Arrangements: Parent Legal Guardian: Other:(Self) Name of Psychiatrist: none Name of Therapist: none  Education Status Is patient currently in school?: No Is the patient employed, unemployed or receiving disability?: Employed  Risk to self with the past 6 months Suicidal  Ideation: No Has patient been a risk to self within the past 6 months prior to admission? : No Suicidal Intent: No Has patient had any suicidal intent within the past 6 months prior to admission? : No Is patient at risk for suicide?: No Suicidal Plan?: No Has patient had any suicidal plan within the past 6 months prior to admission? : No Access to Means:  No What has been your use of drugs/alcohol within the last 12 months?: none Previous Attempts/Gestures: No How many times?: 0 Other Self Harm Risks: none Triggers for Past Attempts: None known Intentional Self Injurious Behavior: None Family Suicide History: (cousin and aunt) Recent stressful life event(s): Loss (Comment)(grandmother died) Persecutory voices/beliefs?: No Depression: Yes Depression Symptoms: Insomnia, Tearfulness, Despondent Substance abuse history and/or treatment for substance abuse?: No Suicide prevention information given to non-admitted patients: Yes  Risk to Others within the past 6 months Homicidal Ideation: No Does patient have any lifetime risk of violence toward others beyond the six months prior to admission? : No Thoughts of Harm to Others: No Current Homicidal Intent: No Current Homicidal Plan: No Access to Homicidal Means: No Identified Victim: pt denies History of harm to others?: No Assessment of Violence: None Noted Violent Behavior Description: none Does patient have access to weapons?: No Criminal Charges Pending?: No Does patient have a court date: No Is patient on probation?: No  Psychosis Hallucinations: None noted Delusions: None noted  Mental Status Report Appearance/Hygiene: Unremarkable Eye Contact: Good Motor Activity: Freedom of movement Speech: Logical/coherent Level of Consciousness: Alert Mood: Depressed Affect: Depressed Anxiety Level: None Thought Processes: Coherent, Relevant Judgement: Partial Orientation: Person, Place, Time, Situation Obsessive Compulsive Thoughts/Behaviors: None  Cognitive Functioning Concentration: Normal Memory: Recent Intact, Remote Intact Is patient IDD: No Insight: Fair Impulse Control: Good Appetite: Poor Have you had any weight changes? : No Change Sleep: Decreased Total Hours of Sleep: 6 Vegetative Symptoms: None  ADLScreening [facility] Assessment Services) Patient's cognitive  ability adequate to safely complete daily activities?: Yes Patient able to express need for assistance with ADLs?: Yes Independently performs ADLs?: Yes (appropriate for developmental age)  Prior Inpatient Therapy Prior Inpatient Therapy: No  Prior Outpatient Therapy Prior Outpatient Therapy: Yes Prior Therapy Dates: 2012-2016 Prior Therapy Facilty/Provider(s): unknown Reason for Treatment: depression Does patient have an ACCT team?: No Does patient have Intensive In-House Services?  : No Does patient have [facility] services? : No Does patient have P4CC services?: No  ADL Screening (condition at time of admission) Patient's cognitive ability adequate to safely complete daily activities?: Yes Patient able to express need for assistance with ADLs?: Yes Independently performs ADLs?: Yes (appropriate for developmental age)       Abuse/Neglect Assessment (Assessment to be complete while patient is alone) Abuse/Neglect Assessment Can Be Completed: Yes Physical Abuse: Denies Verbal Abuse: Denies Sexual Abuse: Denies Exploitation of patient/patient's resources: Denies Self-Neglect: Denies Values / Beliefs Cultural Requests During Hospitalization: None Spiritual Requests During Hospitalization: None Consults Spiritual Care Consult Needed: No Social Work Consult Needed: No Merchant navy officerAdvance Directives (For Healthcare) Does Patient Have a Medical Advance Directive?: No          Disposition:  Disposition Initial Assessment Completed for this Encounter: Yes   NP Shuvon Rankin recommends the pt be discharged and to follow up with OPT.  Resources were provided to the pt.  RN Joni ReiningNicole and PA [facility] were made aware of the recommendations.  This service was provided via telemedicine using a 2-way, interactive audio and video technology.  Names of  all persons participating in this telemedicine service and their role in this encounter. Name: Kelsey Ramsey Role: Pt  Name:  Role:   Name:   Role:   Name:  Role:     Ottis Stain 03/28/2019 6:17 PM

## 2019-03-28 NOTE — ED Triage Notes (Signed)
Pt here for flank pain, abdominal pain, and headache x 1 week. Sts she's nauseas in the mornings when she wakes up. Recently restarted depo shot.

## 2019-03-28 NOTE — ED Notes (Signed)
Pt given discharge instructions and follow up information. Pt given prescriptions and the opportunity to ask questions. Pt verbalized understanding. Pt discharged from the ED. Signature pad in room not working. Unable to obtain patient signature.

## 2019-03-28 NOTE — ED Provider Notes (Signed)
16:00: Assumed care of patient from Burna Forts PA-C at change of shift pending labs & disposition.   Please see prior provider note for full H&P.  Briefly patient is a 23 yo female here with lower abdominal discomfort & diarrhea.   Labs reviewed- consistent w/ prior. No leukocytosis, anemia, electrolyte disturbance, or abnormality w/ lipase or LFTs. Renal function @ baseline. No UTI.   17:15: RE-EVAL: Patient's abdominal pain is resolved, tolerating PO. However upon my evaluation she is tearful, states she has had issues w/ depression and is frequently sad. Reports vague SI without plan, states she would never go through with this. Will consult TTS.   Seen by [facility]- psychiatrically cleared for discharge w/ resources.   Discharge instructions per prior provider w/ addition of psych resources.       Cherly Anderson, PA-C 03/28/19 1800    Gwyneth Sprout, MD 03/28/19 1827

## 2019-03-28 NOTE — Discharge Instructions (Addendum)
Please read attached information. If you experience any new or worsening signs or symptoms please return to the emergency room for evaluation. Please follow-up with your primary care provider or specialist as discussed. Please use medication prescribed only as directed and discontinue taking if you have any concerning signs or symptoms.   °

## 2019-03-28 NOTE — ED Provider Notes (Signed)
[facility] EMERGENCY DEPARTMENT Provider Note   CSN: [id number] Arrival date & time: 03/28/19  1230    History   Chief Complaint No chief complaint on file.   HPI Kelsey Ramsey is a 23 y.o. female.     HPI  23 year old female presents today with complaints of back pain and diarrhea.  Patient notes her symptoms started approximately 5 days ago.  She notes pain in her right lower back worse in the morning worse with bending forward, worse with palpation.  She denies any trauma or distal neurological deficits.  Patient notes around the same time she developed increased stools.  She notes approximately 5-6 bowel movements per day.  She notes pain in her lower abdomen with bowel movements.  She notes pain at rest diffusely in the lower abdomen, denies any upper abdominal pain.  She denies any nausea or vomiting.  She denies any fever.  No close sick contacts no abnormal food or drink.  She notes that when she eats she has to have a bowel movement.  She notes that when she eats potatoes or bread her symptoms are not as severe.  Denies any history of irritable bowel or Crohn's disease.  She denies any rashes.  Past Medical History:  Diagnosis Date  . Hypertension   . Nephritic syndrome   . Nephrotic syndrome     Patient Active Problem List   Diagnosis Date Noted  . Contraception management 12/28/2018  . 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 Nephrotic syndrome, focal and segmental glomerular lesions 04/06/2016  . Renal hypertension 04/06/2016  . Subcutaneous mass 09/27/2015  . Chronic kidney disease 08/10/2014  . Chronic kidney disease (CKD) stage G3a/A2, moderately decreased glomerular filtration rate (GFR) between 45-59 mL/min/1.73 square meter and albuminuria creatinine ratio between 30-299 mg/g (HCC) 08/10/2014  . Therapeutic drug monitoring 05/12/2012  . History of  TB (tuberculosis) 02/11/2012    Past Surgical History:  Procedure Laterality Date  . RENAL BIOPSY       OB History    Gravida  0   Para  0   Term  0   Preterm  0   AB  0   Living  0     SAB  0   TAB  0   Ectopic  0   Multiple  0   Live Births  0            Home Medications    Prior to Admission medications   Medication Sig Start Date End Date Taking? Authorizing Provider  medroxyPROGESTERone (DEPO-PROVERA) 150 MG/ML injection Inject 1 mL (150 mg total) into the muscle every 3 (three) months. 12/28/18  Yes Hermina Staggers, MD  loperamide (IMODIUM) 2 MG capsule Take 1 capsule (2 mg total) by mouth 4 (four) times daily as needed for diarrhea or loose stools. 03/28/19   Delrose Rohwer, Tinnie Gens, PA-C  ondansetron (ZOFRAN) 4 MG tablet Take 1 tablet (4 mg total) by mouth every 6 (six) hours. 03/28/19   Eyvonne Mechanic, PA-C    Family History Family History  Problem Relation Age of Onset  . Diabetes Paternal Grandfather   . Stroke Maternal Grandmother   . Cancer Maternal Grandfather   . Diabetes Maternal Grandfather   . Hypertension Mother   . Heart disease Mother   . Thyroid disease Mother     Social History Social History   Tobacco Use  . Smoking  status: Never Smoker  . Smokeless tobacco: Never Used  Substance Use Topics  . Alcohol use: No  . Drug use: No     Allergies   Pineapple   Review of Systems Review of Systems  All other systems reviewed and are negative.  Physical Exam Updated Vital Signs BP 123/87 (BP Location: Right Arm)   Pulse 77   Temp 98 F (36.7 C) (Oral)   Resp 16   Ht 5\' 7"  (1.702 m)   Wt 104.3 kg   LMP 01/26/2019 (Approximate)   SpO2 97%   BMI 36.02 kg/m   Physical Exam Vitals signs and nursing note reviewed.  Constitutional:      Appearance: She is well-developed.  HENT:     Head: Normocephalic and atraumatic.  Eyes:     General: No scleral icterus.       Right eye: No discharge.        Left eye: No discharge.      Conjunctiva/sclera: Conjunctivae normal.     Pupils: Pupils are equal, round, and reactive to light.  Neck:     Musculoskeletal: Normal range of motion.     Vascular: No JVD.     Trachea: No tracheal deviation.  Pulmonary:     Effort: Pulmonary effort is normal.     Breath sounds: No stridor.  Abdominal:     Comments: Minimal tenderness palpation of the mid left lower abdomen, no right lower quadrant tenderness upper abdomen soft nontender-no rebound or guarding.  Neurological:     Mental Status: She is alert and oriented to person, place, and time.     Coordination: Coordination normal.  Psychiatric:        Behavior: Behavior normal.        Thought Content: Thought content normal.        Judgment: Judgment normal.     ED Treatments / Results  Labs (all labs ordered are listed, but only abnormal results are displayed) Labs Reviewed  CBC WITH DIFFERENTIAL/PLATELET  COMPREHENSIVE METABOLIC PANEL  LIPASE, BLOOD  URINALYSIS, ROUTINE W REFLEX MICROSCOPIC  POC URINE PREG, ED    EKG None  Radiology No results found.  Procedures Procedures (including critical care time)  Medications Ordered in ED Medications  oxyCODONE-acetaminophen (PERCOCET/ROXICET) 5-325 MG per tablet 1 tablet (has no administration in time range)     Initial Impression / Assessment and Plan / ED Course  I have reviewed the triage vital signs and the nursing notes.  Pertinent labs & imaging results that were available during my care of the patient were reviewed by me and considered in my medical decision making (see chart for details).        Labs: CBC, CMP, lipase, point-of-care urine prior, urinalysis  Imaging:  Consults:  Therapeutics: Percocet, Zofran  Discharge Meds: Imodium, Zofran  Assessment/Plan: 23 year old female presents today with lower abdominal pain and diarrhea.  High suspicion for gastroenteritis.  Patient will have basic laboratory analysis.  She has no episodes of  diarrhea while here.  Should be given Percocet for discomfort and Zofran for her nausea.  She has tenderness along her lower lumbar musculature this back pain is most likely muscular in nature.  She also notes vaginal bleeding which is baseline for the last several months likely secondary to change in hormone therapy.  She does not have signs or symptoms consistent with pelvic etiology presently.  If labs show no significant abnormalities I feel she would be stable for outpatient management with primary care follow-up  within 1 week.  She will be discharged with Imodium and Zofran.    Final Clinical Impressions(s) / ED Diagnoses   Final diagnoses:  Gastroenteritis    ED Discharge Orders         Ordered    ondansetron (ZOFRAN) 4 MG tablet  Every 6 hours     03/28/19 1531    loperamide (IMODIUM) 2 MG capsule  4 times daily PRN     03/28/19 1531           Eyvonne Mechanic, PA-C 03/28/19 1533    Gwyneth Sprout, MD 03/28/19 1551

## 2019-09-26 ENCOUNTER — Encounter: Payer: Self-pay | Admitting: Obstetrics

## 2019-09-26 ENCOUNTER — Ambulatory Visit (INDEPENDENT_AMBULATORY_CARE_PROVIDER_SITE_OTHER): Payer: Medicaid Other | Admitting: Obstetrics

## 2019-09-26 ENCOUNTER — Other Ambulatory Visit (HOSPITAL_COMMUNITY)
Admission: RE | Admit: 2019-09-26 | Discharge: 2019-09-26 | Disposition: A | Discharge status: Home / Self Care | Payer: Medicaid Other | Source: Ambulatory Visit | Attending: Obstetrics | Admitting: Obstetrics

## 2019-09-26 ENCOUNTER — Other Ambulatory Visit: Payer: Self-pay

## 2019-09-26 VITALS — BP 135/90 | HR 75 | Ht 67.0 in | Wt 237.9 lb

## 2019-09-26 DIAGNOSIS — N181 Chronic kidney disease, stage 1: Secondary | ICD-10-CM

## 2019-09-26 DIAGNOSIS — Z01419 Encounter for gynecological examination (general) (routine) without abnormal findings: Secondary | ICD-10-CM | POA: Diagnosis not present

## 2019-09-26 DIAGNOSIS — N898 Other specified noninflammatory disorders of vagina: Secondary | ICD-10-CM | POA: Insufficient documentation

## 2019-09-26 DIAGNOSIS — I150 Renovascular hypertension: Secondary | ICD-10-CM

## 2019-09-26 DIAGNOSIS — E669 Obesity, unspecified: Secondary | ICD-10-CM

## 2019-09-26 DIAGNOSIS — Z23 Encounter for immunization: Secondary | ICD-10-CM

## 2019-09-26 DIAGNOSIS — Z3009 Encounter for other general counseling and advice on contraception: Secondary | ICD-10-CM

## 2019-09-26 DIAGNOSIS — Z113 Encounter for screening for infections with a predominantly sexual mode of transmission: Secondary | ICD-10-CM

## 2019-09-26 DIAGNOSIS — Z0000 Encounter for general adult medical examination without abnormal findings: Secondary | ICD-10-CM | POA: Diagnosis not present

## 2019-09-26 NOTE — Progress Notes (Signed)
Subjective:        Kelsey Ramsey is a 23 y.o. female here for a routine exam.  Current complaints: None.  Has a history of renal disease since she was 23 years old, with no medical management for the past 3 years because of lack of insurance.  Personal health questionnaire:  Is patient Ashkenazi Jewish, have a family history of breast and/or ovarian cancer: no Is there a family history of uterine cancer diagnosed at age < 4, gastrointestinal cancer, urinary tract cancer, family member who is a Field seismologist syndrome-associated carrier: no Is the patient overweight and hypertensive, family history of diabetes, personal history of gestational diabetes, preeclampsia or PCOS: yes Is patient over 70, have PCOS,  family history of premature CHD under age 94, diabetes, smoke, have hypertension or peripheral artery disease:  yes At any time, has a partner hit, kicked or otherwise hurt or frightened you?: no Over the past 2 weeks, have you felt down, depressed or hopeless?: no Over the past 2 weeks, have you felt little interest or pleasure in doing things?:no   Gynecologic History Patient's last menstrual period was 09/15/2019. Contraception: none Last Pap: n/a. Results were: n/a Last mammogram: n/a. Results were: n/a  Obstetric History OB History  Gravida Para Term Preterm AB Living  0 0 0 0 0 0  SAB TAB Ectopic Multiple Live Births  0 0 0 0 0    Past Medical History:  Diagnosis Date  . Hypertension   . Nephritic syndrome   . Nephrotic syndrome     Past Surgical History:  Procedure Laterality Date  . RENAL BIOPSY       Current Outpatient Medications:  .  loperamide (IMODIUM) 2 MG capsule, Take 1 capsule (2 mg total) by mouth 4 (four) times daily as needed for diarrhea or loose stools. (Patient not taking: Reported on 09/26/2019), Disp: 12 capsule, Rfl: 0 .  medroxyPROGESTERone (DEPO-PROVERA) 150 MG/ML injection, Inject 1 mL (150 mg total) into the muscle every 3 (three) months.  (Patient not taking: Reported on 09/26/2019), Disp: 1 mL, Rfl: 3 .  ondansetron (ZOFRAN) 4 MG tablet, Take 1 tablet (4 mg total) by mouth every 6 (six) hours. (Patient not taking: Reported on 09/26/2019), Disp: 12 tablet, Rfl: 0  Current Facility-Administered Medications:  .  medroxyPROGESTERone (DEPO-PROVERA) injection 150 mg, 150 mg, Intramuscular, Q90 days, Chancy Milroy, MD, 150 mg at 12/28/18 1556 Allergies  Allergen Reactions  . Pineapple Itching and Swelling    Social History   Tobacco Use  . Smoking status: Never Smoker  . Smokeless tobacco: Never Used  Substance Use Topics  . Alcohol use: No    Family History  Problem Relation Age of Onset  . Diabetes Paternal Grandfather   . Stroke Maternal Grandmother   . Cancer Maternal Grandfather   . Diabetes Maternal Grandfather   . Hypertension Mother   . Heart disease Mother   . Thyroid disease Mother       Review of Systems  Constitutional: negative for fatigue and weight loss Respiratory: negative for cough and wheezing Cardiovascular: negative for chest pain, fatigue and palpitations Gastrointestinal: negative for abdominal pain and change in bowel habits Musculoskeletal:negative for myalgias Neurological: negative for gait problems and tremors Behavioral/Psych: negative for abusive relationship, depression Endocrine: negative for temperature intolerance    Genitourinary:negative for abnormal menstrual periods, genital lesions, hot flashes, sexual problems and vaginal discharge Integument/breast: negative for breast lump, breast tenderness, nipple discharge and skin lesion(s)    Objective:  BP 135/90   Pulse 75   Ht 5\' 7"  (1.702 m)   Wt 237 lb 14.4 oz (107.9 kg)   LMP 09/15/2019   BMI 37.26 kg/m  General:   alert  Skin:   no rash or abnormalities  Lungs:   clear to auscultation bilaterally  Heart:   regular rate and rhythm, S1, S2 normal, no murmur, click, rub or gallop  Breasts:   normal without  suspicious masses, skin or nipple changes or axillary nodes  Abdomen:  normal findings: no organomegaly, soft, non-tender and no hernia  Pelvis:  External genitalia: normal general appearance Urinary system: urethral meatus normal and bladder without fullness, nontender Vaginal: normal without tenderness, induration or masses Cervix: normal appearance Adnexa: normal bimanual exam Uterus: anteverted and non-tender, normal size   Lab Review Urine pregnancy test Labs reviewed yes Radiologic studies reviewed no  50% of 25 min visit spent on counseling and coordination of care.   Assessment:    Healthy female exam.    Plan:    Education reviewed: calcium supplements, depression evaluation, low fat, low cholesterol diet, safe sex/STD prevention, self breast exams, skin cancer screening and weight bearing exercise. Contraception: none. Follow up in: 1 year.   No orders of the defined types were placed in this encounter.  Orders Placed This Encounter  Procedures  . Flu Vaccine QUAD 36+ mos IM (Fluarix, Quad PF)  . RPR  . HIV Antibody (routine testing w rflx)  . Ambulatory referral to Internal Medicine    Referral Priority:   Routine    Referral Type:   Consultation    Referral Reason:   Specialty Services Required    Requested Specialty:   Internal Medicine    Number of Visits Requested:   1    09/17/2019, MD 09/26/2019 3:46 PM

## 2019-09-26 NOTE — Progress Notes (Signed)
Pt is in the office for annual. Pt states she previously was taking a BP pill about 2 years ago, but her PCP at the time told her she did not need it anymore. Pt reports occasional headaches, BP slightly elevated today.  GAD7= 13

## 2019-09-27 LAB — RPR: RPR Ser Ql: NONREACTIVE

## 2019-09-27 LAB — CERVICOVAGINAL ANCILLARY ONLY
Chlamydia: NEGATIVE
Comment: NEGATIVE
Comment: NORMAL
Neisseria Gonorrhea: NEGATIVE

## 2019-09-27 LAB — HIV ANTIBODY (ROUTINE TESTING W REFLEX): HIV Screen 4th Generation wRfx: NONREACTIVE

## 2019-09-28 ENCOUNTER — Other Ambulatory Visit: Payer: Self-pay | Admitting: Obstetrics

## 2019-09-28 ENCOUNTER — Telehealth: Payer: Self-pay

## 2019-09-28 DIAGNOSIS — A5901 Trichomonal vulvovaginitis: Secondary | ICD-10-CM

## 2019-09-28 LAB — CYTOLOGY - PAP
Comment: NEGATIVE
Diagnosis: NEGATIVE
High risk HPV: NEGATIVE

## 2019-09-28 MED ORDER — METRONIDAZOLE 500 MG PO TABS: 500.0000 mg | ORAL_TABLET | Freq: Two times a day (BID) | ORAL | 2 refills | Status: DC

## 2019-09-28 NOTE — Telephone Encounter (Signed)
  Patient notified of results and RX, to abstain from sex, partner to be treated and she needs TOC 2-4 weeks.  She verbalized understanding.

## 2019-09-28 NOTE — Telephone Encounter (Signed)
-----   Message from Shelly Bombard, MD sent at 09/28/2019  3:57 PM EST ----- Positive Trichomonas on Pap Smear Flagyl Rx

## 2019-10-30 ENCOUNTER — Ambulatory Visit: Payer: Medicaid Other

## 2019-10-31 ENCOUNTER — Other Ambulatory Visit (HOSPITAL_COMMUNITY)
Admission: RE | Admit: 2019-10-31 | Discharge: 2019-10-31 | Disposition: A | Discharge status: Home / Self Care | Payer: Medicaid Other | Source: Ambulatory Visit | Attending: Obstetrics | Admitting: Obstetrics

## 2019-10-31 ENCOUNTER — Ambulatory Visit: Payer: Medicaid Other

## 2019-10-31 ENCOUNTER — Other Ambulatory Visit: Payer: Self-pay

## 2019-10-31 DIAGNOSIS — N898 Other specified noninflammatory disorders of vagina: Secondary | ICD-10-CM | POA: Diagnosis present

## 2019-10-31 NOTE — Progress Notes (Signed)
Pt is in the office for self swab for st. Pt states that she is having vaginal irritation, similar to how she has felt in the past. Advised pt that if results are positive, make sure partners are tested and treated, before engaging in sexual contact.

## 2019-11-01 LAB — CERVICOVAGINAL ANCILLARY ONLY
Bacterial Vaginitis (gardnerella): NEGATIVE
Candida Glabrata: NEGATIVE
Candida Vaginitis: POSITIVE — AB
Chlamydia: NEGATIVE
Comment: NEGATIVE
Comment: NEGATIVE
Comment: NEGATIVE
Comment: NEGATIVE
Comment: NEGATIVE
Comment: NORMAL
Neisseria Gonorrhea: NEGATIVE
Trichomonas: NEGATIVE

## 2019-11-02 ENCOUNTER — Other Ambulatory Visit: Payer: Self-pay | Admitting: Obstetrics

## 2019-11-02 DIAGNOSIS — B379 Candidiasis, unspecified: Secondary | ICD-10-CM

## 2019-11-02 MED ORDER — FLUCONAZOLE 150 MG PO TABS: 150.0000 mg | ORAL_TABLET | Freq: Once | ORAL | 0 refills | Status: AC

## 2019-11-23 ENCOUNTER — Ambulatory Visit: Payer: Medicaid Other | Attending: Internal Medicine

## 2019-11-23 DIAGNOSIS — Z20822 Contact with and (suspected) exposure to covid-19: Secondary | ICD-10-CM

## 2019-11-25 LAB — NOVEL CORONAVIRUS, NAA: SARS-CoV-2, NAA: NOT DETECTED

## 2019-12-06 ENCOUNTER — Encounter (HOSPITAL_COMMUNITY): Payer: Self-pay

## 2019-12-06 ENCOUNTER — Other Ambulatory Visit: Payer: Self-pay

## 2019-12-06 ENCOUNTER — Ambulatory Visit (HOSPITAL_COMMUNITY)
Admission: EM | Admit: 2019-12-06 | Discharge: 2019-12-06 | Disposition: A | Discharge status: Home / Self Care | Payer: Self-pay | Attending: Urgent Care | Admitting: Urgent Care

## 2019-12-06 DIAGNOSIS — Z3202 Encounter for pregnancy test, result negative: Secondary | ICD-10-CM

## 2019-12-06 DIAGNOSIS — N898 Other specified noninflammatory disorders of vagina: Secondary | ICD-10-CM | POA: Insufficient documentation

## 2019-12-06 LAB — POC URINE PREG, ED: Preg Test, Ur: NEGATIVE

## 2019-12-06 LAB — POCT PREGNANCY, URINE: Preg Test, Ur: NEGATIVE

## 2019-12-06 NOTE — ED Provider Notes (Signed)
  [facility]   MRN: [id number] DOB: 1995-12-16  Subjective:   Kelsey Ramsey is a 23 y.o. female presenting for 5 day history of vaginal irritation after shaving her hair. Has previously done waxing but is very weary of having an STI. Her partner did get checked and was negative. However, she wants to make sure. She admits that her symptoms have improved.   No current facility-administered medications for this encounter. No current outpatient medications on file.   Allergies  Allergen Reactions  . Pineapple Itching and Swelling    Past Medical History:  Diagnosis Date  . Hypertension   . Nephritic syndrome   . Nephrotic syndrome      Past Surgical History:  Procedure Laterality Date  . RENAL BIOPSY      Family History  Problem Relation Age of Onset  . Diabetes Paternal Grandfather   . Stroke Maternal Grandmother   . Cancer Maternal Grandfather   . Diabetes Maternal Grandfather   . Hypertension Mother   . Heart disease Mother   . Thyroid disease Mother     Social History   Tobacco Use  . Smoking status: Current Some Day Smoker    Types: Cigars  . Smokeless tobacco: Never Used  . Tobacco comment: rare  Substance Use Topics  . Alcohol use: Yes    Comment: socially  . Drug use: No    ROS   Objective:   Vitals: BP (!) 142/78 (BP Location: Right Arm)   Pulse 75   Temp 98.6 F (37 C) (Oral)   Resp 16   SpO2 100%   Physical Exam Constitutional:      General: She is not in acute distress.    Appearance: Normal appearance. She is well-developed. She is not ill-appearing, toxic-appearing or diaphoretic.  HENT:     Head: Normocephalic and atraumatic.     Nose: Nose normal.     Mouth/Throat:     Mouth: Mucous membranes are moist.     Pharynx: Oropharynx is clear.  Eyes:     General: No scleral icterus.    Extraocular Movements: Extraocular movements intact.     Pupils: Pupils are equal, round, and reactive to light.  Cardiovascular:   Rate and Rhythm: Normal rate.  Pulmonary:     Effort: Pulmonary effort is normal.  Skin:    General: Skin is warm and dry.  Neurological:     General: No focal deficit present.     Mental Status: She is alert and oriented to person, place, and time.  Psychiatric:        Mood and Affect: Mood normal.        Behavior: Behavior normal.    No results found for this or any previous visit (from the past 24 hour(s)).  Assessment and Plan :   1. Vaginal itching     Suspect irritation from shaving, recommended going back to waxing as she did better with this. Sx have improved and prefer to use lab data for treatment. Patient is in agreement. Counseled patient on potential for adverse effects with medications prescribed/recommended today, ER and return-to-clinic precautions discussed, patient verbalized understanding.    Wallis Bamberg, PA-C 12/06/19 1758

## 2019-12-06 NOTE — ED Triage Notes (Signed)
Patient presents to Urgent Care with complaints of vaginal tingling since shaving her vagina a few days ago. Patient reports she also knows she is being cheated on and would like to be checked for STDs.

## 2019-12-08 ENCOUNTER — Telehealth (HOSPITAL_COMMUNITY): Payer: Self-pay | Admitting: Emergency Medicine

## 2019-12-08 LAB — CERVICOVAGINAL ANCILLARY ONLY
Bacterial vaginitis: POSITIVE — AB
Candida vaginitis: NEGATIVE
Chlamydia: NEGATIVE
Neisseria Gonorrhea: NEGATIVE
Trichomonas: NEGATIVE

## 2019-12-08 MED ORDER — METRONIDAZOLE 500 MG PO TABS: 500.0000 mg | ORAL_TABLET | Freq: Two times a day (BID) | ORAL | 0 refills | Status: AC

## 2019-12-08 NOTE — Telephone Encounter (Signed)
Bacterial vaginosis is positive. Pt needs treatment. Flagyl 500 mg BID x 7 days #14 no refills sent to patients pharmacy of choice.    Patient contacted by phone and made aware of    results. Pt verbalized understanding and had all questions answered.    

## 2019-12-12 ENCOUNTER — Ambulatory Visit: Payer: Medicaid Other | Admitting: Obstetrics

## 2019-12-12 ENCOUNTER — Ambulatory Visit: Payer: Medicaid Other

## 2019-12-21 ENCOUNTER — Ambulatory Visit: Payer: Medicaid Other | Admitting: Obstetrics

## 2020-01-11 ENCOUNTER — Ambulatory Visit: Payer: Medicaid Other | Admitting: Obstetrics

## 2020-01-24 ENCOUNTER — Ambulatory Visit: Payer: Medicaid Other | Admitting: Obstetrics

## 2020-04-04 ENCOUNTER — Ambulatory Visit (INDEPENDENT_AMBULATORY_CARE_PROVIDER_SITE_OTHER): Payer: 59

## 2020-04-04 ENCOUNTER — Other Ambulatory Visit (HOSPITAL_COMMUNITY)
Admission: RE | Admit: 2020-04-04 | Discharge: 2020-04-04 | Disposition: A | Discharge status: Home / Self Care | Payer: 59 | Source: Ambulatory Visit | Attending: Obstetrics and Gynecology | Admitting: Obstetrics and Gynecology

## 2020-04-04 ENCOUNTER — Other Ambulatory Visit: Payer: Self-pay

## 2020-04-04 DIAGNOSIS — Z3202 Encounter for pregnancy test, result negative: Secondary | ICD-10-CM | POA: Diagnosis not present

## 2020-04-04 DIAGNOSIS — Z113 Encounter for screening for infections with a predominantly sexual mode of transmission: Secondary | ICD-10-CM | POA: Diagnosis present

## 2020-04-04 DIAGNOSIS — N926 Irregular menstruation, unspecified: Secondary | ICD-10-CM

## 2020-04-04 LAB — POCT URINE PREGNANCY: Preg Test, Ur: NEGATIVE

## 2020-04-04 NOTE — Progress Notes (Signed)
Kelsey Ramsey is here for STD screening. Pt reports vaginal discharge and a new partner. Results pending. Also pt reports missing her period in March but had a period in Jan. Feb. And April.  She stated that she had been on depo for 5+ years or so.  She also gained a lot of weight and is currently being treated for depression. UPT negative. -EH/RMA

## 2020-04-05 ENCOUNTER — Other Ambulatory Visit: Payer: Self-pay | Admitting: Obstetrics and Gynecology

## 2020-04-05 LAB — CERVICOVAGINAL ANCILLARY ONLY
Bacterial Vaginitis (gardnerella): POSITIVE — AB
Candida Glabrata: NEGATIVE
Candida Vaginitis: POSITIVE — AB
Chlamydia: NEGATIVE
Comment: NEGATIVE
Comment: NEGATIVE
Comment: NEGATIVE
Comment: NEGATIVE
Comment: NEGATIVE
Comment: NORMAL
Neisseria Gonorrhea: NEGATIVE
Trichomonas: NEGATIVE

## 2020-04-05 LAB — HIV ANTIBODY (ROUTINE TESTING W REFLEX): HIV Screen 4th Generation wRfx: NONREACTIVE

## 2020-04-05 LAB — HEPATITIS C ANTIBODY: Hep C Virus Ab: <0.1 s/co ratio (ref 0.0–0.9)

## 2020-04-05 LAB — RPR: RPR Ser Ql: NONREACTIVE

## 2020-04-05 MED ORDER — METRONIDAZOLE 500 MG PO TABS: 500.0000 mg | ORAL_TABLET | Freq: Two times a day (BID) | ORAL | 0 refills | Status: DC

## 2020-04-05 MED ORDER — FLUCONAZOLE 150 MG PO TABS: 150.0000 mg | ORAL_TABLET | Freq: Once | ORAL | 1 refills | Status: AC

## 2020-04-24 NOTE — Progress Notes (Signed)
I have reviewed this chart and agree with the RN/CMA assessment and management.    K. Meryl Corrado Hymon, M.D. Attending [facility] (Faculty Practice)   

## 2020-05-15 ENCOUNTER — Other Ambulatory Visit: Payer: Self-pay

## 2020-05-15 ENCOUNTER — Ambulatory Visit (INDEPENDENT_AMBULATORY_CARE_PROVIDER_SITE_OTHER): Payer: 59 | Admitting: *Deleted

## 2020-05-15 ENCOUNTER — Other Ambulatory Visit (HOSPITAL_COMMUNITY)
Admission: RE | Admit: 2020-05-15 | Discharge: 2020-05-15 | Disposition: A | Discharge status: Home / Self Care | Payer: 59 | Source: Ambulatory Visit | Attending: Obstetrics and Gynecology | Admitting: Obstetrics and Gynecology

## 2020-05-15 DIAGNOSIS — N898 Other specified noninflammatory disorders of vagina: Secondary | ICD-10-CM | POA: Diagnosis present

## 2020-05-15 DIAGNOSIS — Z113 Encounter for screening for infections with a predominantly sexual mode of transmission: Secondary | ICD-10-CM

## 2020-05-15 DIAGNOSIS — R399 Unspecified symptoms and signs involving the genitourinary system: Secondary | ICD-10-CM | POA: Diagnosis not present

## 2020-05-15 LAB — POCT URINALYSIS DIPSTICK
Bilirubin, UA: NEGATIVE
Glucose, UA: NEGATIVE
Ketones, UA: NEGATIVE
Leukocytes, UA: NEGATIVE
Nitrite, UA: NEGATIVE
Protein, UA: POSITIVE — AB
Spec Grav, UA: 1.02 (ref 1.010–1.025)
Urobilinogen, UA: 0.2 E.U./dL
pH, UA: 6 (ref 5.0–8.0)

## 2020-05-15 NOTE — Progress Notes (Signed)
Pt is in office for self swab for vaginal itching and clumpy discharge.  Pt states she would like all testing on swab.  Pt also states she is having some abd discomfort - ?urinary.   Vaginal swab- GC/CH, Trich, BV and yeast sent today. Udip and culture to be sent today.   Pt to be contacted once labs resulted.

## 2020-05-16 LAB — CERVICOVAGINAL ANCILLARY ONLY
Bacterial Vaginitis (gardnerella): POSITIVE — AB
Candida Glabrata: NEGATIVE
Candida Vaginitis: NEGATIVE
Chlamydia: NEGATIVE
Comment: NEGATIVE
Comment: NEGATIVE
Comment: NEGATIVE
Comment: NEGATIVE
Comment: NEGATIVE
Comment: NORMAL
Neisseria Gonorrhea: NEGATIVE
Trichomonas: NEGATIVE

## 2020-05-17 ENCOUNTER — Other Ambulatory Visit: Payer: Self-pay | Admitting: Obstetrics & Gynecology

## 2020-05-17 DIAGNOSIS — N898 Other specified noninflammatory disorders of vagina: Secondary | ICD-10-CM

## 2020-05-17 LAB — URINE CULTURE

## 2020-05-17 MED ORDER — METRONIDAZOLE 500 MG PO TABS: 500.0000 mg | ORAL_TABLET | Freq: Two times a day (BID) | ORAL | 0 refills | Status: DC

## 2020-05-17 NOTE — Progress Notes (Signed)
Meds ordered this encounter  Medications  . metroNIDAZOLE (FLAGYL) 500 MG tablet    Sig: Take 1 tablet (500 mg total) by mouth 2 (two) times daily.    Dispense:  14 tablet    Refill:  0    

## 2020-05-22 ENCOUNTER — Other Ambulatory Visit: Payer: Self-pay

## 2020-05-22 ENCOUNTER — Other Ambulatory Visit: Payer: Self-pay | Admitting: Obstetrics & Gynecology

## 2020-05-22 DIAGNOSIS — R399 Unspecified symptoms and signs involving the genitourinary system: Secondary | ICD-10-CM

## 2020-05-22 DIAGNOSIS — N390 Urinary tract infection, site not specified: Secondary | ICD-10-CM

## 2020-05-22 MED ORDER — SULFAMETHOXAZOLE-TRIMETHOPRIM 800-160 MG PO TABS: 1.0000 | ORAL_TABLET | Freq: Two times a day (BID) | ORAL | 0 refills | Status: DC

## 2020-05-22 NOTE — Progress Notes (Signed)
UTI, can't e prescribe please call in Rx Bactrim as ordered

## 2020-05-28 ENCOUNTER — Telehealth: Payer: Self-pay

## 2020-05-28 NOTE — Telephone Encounter (Signed)
Patient called in, answered questions about medications.

## 2020-06-03 ENCOUNTER — Emergency Department (HOSPITAL_COMMUNITY)
Admission: EM | Admit: 2020-06-03 | Discharge: 2020-06-03 | Disposition: A | Discharge status: Home / Self Care | Payer: 59 | Attending: Emergency Medicine | Admitting: Emergency Medicine

## 2020-06-03 ENCOUNTER — Encounter (HOSPITAL_COMMUNITY): Payer: Self-pay

## 2020-06-03 ENCOUNTER — Other Ambulatory Visit: Payer: Self-pay

## 2020-06-03 DIAGNOSIS — M545 Low back pain: Secondary | ICD-10-CM | POA: Diagnosis present

## 2020-06-03 DIAGNOSIS — Z5321 Procedure and treatment not carried out due to patient leaving prior to being seen by health care provider: Secondary | ICD-10-CM | POA: Diagnosis not present

## 2020-06-03 LAB — CBC
HCT: 45 % (ref 36.0–46.0)
Hemoglobin: 15 g/dL (ref 12.0–15.0)
MCH: 28.7 pg (ref 26.0–34.0)
MCHC: 33.3 g/dL (ref 30.0–36.0)
MCV: 86.2 fL (ref 80.0–100.0)
Platelets: 154 10*3/uL (ref 150–400)
RBC: 5.22 MIL/uL — ABNORMAL HIGH (ref 3.87–5.11)
RDW: 14.6 % (ref 11.5–15.5)
WBC: 4.3 10*3/uL (ref 4.0–10.5)
nRBC: 0 % (ref 0.0–0.2)

## 2020-06-03 LAB — COMPREHENSIVE METABOLIC PANEL
ALT: 21 U/L (ref 0–44)
AST: 22 U/L (ref 15–41)
Albumin: 3.9 g/dL (ref 3.5–5.0)
Alkaline Phosphatase: 68 U/L (ref 38–126)
Anion gap: 11 (ref 5–15)
BUN: 12 mg/dL (ref 6–20)
CO2: 25 mmol/L (ref 22–32)
Calcium: 9.6 mg/dL (ref 8.9–10.3)
Chloride: 102 mmol/L (ref 98–111)
Creatinine, Ser: 1.3 mg/dL — ABNORMAL HIGH (ref 0.44–1.00)
GFR calc Af Amer: >60 mL/min (ref >60)
GFR calc non Af Amer: 59 mL/min — ABNORMAL LOW (ref >60)
Glucose, Bld: 94 mg/dL (ref 70–99)
Potassium: 3.9 mmol/L (ref 3.5–5.1)
Sodium: 138 mmol/L (ref 135–145)
Total Bilirubin: 0.4 mg/dL (ref 0.3–1.2)
Total Protein: 7.8 g/dL (ref 6.5–8.1)

## 2020-06-03 LAB — URINALYSIS, ROUTINE W REFLEX MICROSCOPIC
Bilirubin Urine: NEGATIVE
Glucose, UA: NEGATIVE mg/dL
Hgb urine dipstick: NEGATIVE
Ketones, ur: NEGATIVE mg/dL
Nitrite: NEGATIVE
Protein, ur: 30 mg/dL — AB
Specific Gravity, Urine: 1.015 (ref 1.005–1.030)
Squamous Epithelial / HPF: >50 — ABNORMAL HIGH (ref 0–5)
pH: 6 (ref 5.0–8.0)

## 2020-06-03 LAB — I-STAT BETA HCG BLOOD, ED (MC, WL, AP ONLY): I-stat hCG, quantitative: <5 m[IU]/mL (ref <5)

## 2020-06-03 LAB — LIPASE, BLOOD: Lipase: 44 U/L (ref 11–51)

## 2020-06-03 NOTE — ED Triage Notes (Signed)
Pt reports "kidney pain" and lower back pain for the past few days, pt recently treated for BV from OBGYN office. Pt denies any vaginal bleeding or discharge.

## 2020-06-03 NOTE — ED Notes (Signed)
No answer

## 2020-06-12 ENCOUNTER — Other Ambulatory Visit: Payer: Self-pay

## 2020-06-12 ENCOUNTER — Other Ambulatory Visit (HOSPITAL_COMMUNITY)
Admission: RE | Admit: 2020-06-12 | Discharge: 2020-06-12 | Disposition: A | Discharge status: Home / Self Care | Payer: 59 | Source: Ambulatory Visit | Attending: Advanced Practice Midwife | Admitting: Advanced Practice Midwife

## 2020-06-12 ENCOUNTER — Ambulatory Visit (INDEPENDENT_AMBULATORY_CARE_PROVIDER_SITE_OTHER): Payer: 59 | Admitting: Advanced Practice Midwife

## 2020-06-12 VITALS — BP 139/80 | HR 67 | Wt 225.0 lb

## 2020-06-12 DIAGNOSIS — R300 Dysuria: Secondary | ICD-10-CM | POA: Diagnosis not present

## 2020-06-12 DIAGNOSIS — R109 Unspecified abdominal pain: Secondary | ICD-10-CM

## 2020-06-12 DIAGNOSIS — B9689 Other specified bacterial agents as the cause of diseases classified elsewhere: Secondary | ICD-10-CM

## 2020-06-12 DIAGNOSIS — B373 Candidiasis of vulva and vagina: Secondary | ICD-10-CM

## 2020-06-12 DIAGNOSIS — N3000 Acute cystitis without hematuria: Secondary | ICD-10-CM

## 2020-06-12 DIAGNOSIS — N760 Acute vaginitis: Secondary | ICD-10-CM

## 2020-06-12 DIAGNOSIS — B3731 Acute candidiasis of vulva and vagina: Secondary | ICD-10-CM

## 2020-06-12 LAB — POCT URINALYSIS DIPSTICK
Bilirubin, UA: NEGATIVE
Glucose, UA: NEGATIVE
Ketones, UA: NEGATIVE
Leukocytes, UA: NEGATIVE
Nitrite, UA: NEGATIVE
Protein, UA: POSITIVE — AB
Spec Grav, UA: 1.015 (ref 1.010–1.025)
Urobilinogen, UA: 0.2 E.U./dL
pH, UA: 6 (ref 5.0–8.0)

## 2020-06-12 NOTE — Progress Notes (Signed)
  GYNECOLOGY PROGRESS NOTE  History:  24 y.o. G0P0000 presents to [facility] office today for problem gyn visit. She reports lower abdominal cramping, back pain, and concerns that she still has UTI or BV. She completed treatment for both last week.  She reports that she has hx of BV with her current sexual partner.  She reports watery vaginal discharge but no odor, itching, or irritation currently.   She denies h/a, dizziness, shortness of breath, n/v, or fever/chills.    The following portions of the patient's history were reviewed and updated as appropriate: allergies, current medications, past family history, past medical history, past social history, past surgical history and problem list. Last pap smear on 09/26/2019 was normal.  Review of Systems:  Pertinent items are noted in HPI.   Objective:  Physical Exam Blood pressure 139/80, pulse 67, weight 225 lb (102.1 kg), last menstrual period 05/16/2020. VS reviewed, nursing note reviewed,  Constitutional: well developed, well nourished, no distress HEENT: normocephalic CV: normal rate Pulm/chest wall: normal effort Breast Exam: deferred Abdomen: soft Neuro: alert and oriented x 3 Skin: warm, dry Psych: affect normal MSK: Negative CVA tenderness Pelvic exam: Deferred  Assessment & Plan:  1. Flank pain --Neg CVA tenderness, no pain to palpation --Will wait for urine and vaginal lab results prior to offering treatment --Increase PO fluids --Pt given printed and verbal instructions on preventing vaginal infections including use of probiotics  - POCT urinalysis dipstick - Urine Culture - Cervicovaginal ancillary only( [place])  2. Dysuria  - POCT urinalysis dipstick - Urine Culture - Cervicovaginal ancillary only( [place])   Sharen Counter, CNM 11:38 AM

## 2020-06-12 NOTE — Progress Notes (Signed)
Pt states she was treated for BV and UTI.  Pt states she is still having symptoms of UTI / kidney pain. Pt denies any pain/discomfort with urination.

## 2020-06-12 NOTE — Patient Instructions (Signed)

## 2020-06-12 NOTE — Progress Notes (Signed)
Patient ID: Kelsey Ramsey, female   DOB: [birth year], 24 y.o.   MRN: [id number] Patient was assessed and managed by nursing staff during this encounter. I have reviewed the chart and agree with the documentation and plan. I have also made any necessary editorial changes.  Scheryl Darter, MD 06/12/2020 1:15 PM

## 2020-06-13 LAB — CERVICOVAGINAL ANCILLARY ONLY
Bacterial Vaginitis (gardnerella): POSITIVE — AB
Candida Glabrata: NEGATIVE
Candida Vaginitis: POSITIVE — AB
Chlamydia: NEGATIVE
Comment: NEGATIVE
Comment: NEGATIVE
Comment: NEGATIVE
Comment: NEGATIVE
Comment: NEGATIVE
Comment: NORMAL
Neisseria Gonorrhea: NEGATIVE
Trichomonas: NEGATIVE

## 2020-06-14 MED ORDER — METRONIDAZOLE 500 MG PO TABS: 500.0000 mg | ORAL_TABLET | Freq: Two times a day (BID) | ORAL | 0 refills | Status: AC

## 2020-06-14 MED ORDER — FLUCONAZOLE 150 MG PO TABS: 150.0000 mg | ORAL_TABLET | Freq: Once | ORAL | 1 refills | Status: AC

## 2020-06-15 LAB — URINE CULTURE

## 2020-06-19 MED ORDER — CEPHALEXIN 500 MG PO CAPS: 500.0000 mg | ORAL_CAPSULE | Freq: Four times a day (QID) | ORAL | 2 refills | Status: DC

## 2020-06-19 MED ORDER — METRONIDAZOLE 0.75 % VA GEL: 1.0000 | Freq: Every day | VAGINAL | 1 refills | Status: DC

## 2020-06-19 MED ORDER — FLUCONAZOLE 150 MG PO TABS: 150.0000 mg | ORAL_TABLET | Freq: Once | ORAL | 1 refills | Status: AC

## 2020-06-19 NOTE — Addendum Note (Signed)
Addended by: Sharen Counter A on: 06/19/2020 11:51 AM   Modules accepted: Orders

## 2020-06-26 ENCOUNTER — Other Ambulatory Visit: Payer: Self-pay

## 2020-06-26 ENCOUNTER — Encounter: Payer: 59 | Attending: Pediatric Nephrology | Admitting: Skilled Nursing Facility1

## 2020-06-26 ENCOUNTER — Encounter: Payer: Self-pay | Admitting: Skilled Nursing Facility1

## 2020-06-26 DIAGNOSIS — N182 Chronic kidney disease, stage 2 (mild): Secondary | ICD-10-CM | POA: Insufficient documentation

## 2020-06-26 NOTE — Patient Instructions (Addendum)
Look for meditation videos on Netflix!  Look into mental health providers that are covered by your insurance, and choose the one that you feel best working with. Follow up with [facility] to set up an appointment! Remember what health is! It is not weight! [employer], [employer], [employer]. Follow the MyPlacemat eating model.  Work towards consistency  Eat within 1 hour of waking  Eat every 5 hours  Don't eat within 2-3 hours of laying down for bed.

## 2020-06-26 NOTE — Progress Notes (Signed)
Medical Nutrition Therapy:  Appt start time: 1120 end time:  1230.   Assessment:  Primary concerns today: weight management.   Pt reports having low self-esteem in college Pt reports taking medications and feeling that they did not work Pt has had kidney problems since seventh grade. Pt reports having to take steroids, blood pressure meds, and stopped them in ninth grade.  Pt reports painful menstruation that keeps her from eating, and has not seen an OBGYN regarding this problem. Pt reports not wanting to renew birth control because her hair would fall out, and she would be very sick. Pt reports prolonged sadness over the passing of her nana 2 years ago. Pt reports wanting to listen to nature sounds, or find videos for meditations. Pt is not taking Losartin because she is scared to per being educated by physician it would cause fetal malformations (pt states she plans on getting pregnant with her jailbird when she is 27). Pt reports wanting to lose weight. Attributes bad energy to gaining weight. Pt reports nausea most days, reports it comes on before back pain and goes away after urinating. Pt reports having lower back pain when urgency to urinate comes on. Pt reports liking seafood. Pt reports shopping makes her feel good. Pt reports talking to her mother when she feels alone.  Preferred Learning Style:   No preference indicated   Learning Readiness:   Ready  MEDICATIONS: Losartin prescribed, pt not taking   DIETARY INTAKE:  Usual eating pattern includes 1-2 meals and 0 snacks per day.  Everyday foods include rice, potatoes, bread .  Avoided foods include pineapple.    24-hr recall:  B ( AM): 2 Naked drinks, apple juice ~4oz  Snk ( AM): none L ( PM): 8 chicken wings, fries, sweet tea Snk ( PM): none D ( PM): tacos, spanish rice Snk ( PM): none Beverages: water, sweet tea, naked drinks, fruit juice  Usual physical activity: Walking her dog. Shows interest in beginning  structured exercise.   Progress Towards Goal(s):  In progress.   Nutritional Diagnosis:  NB-1.1 Food and nutrition-related knowledge deficit As related to lack of previous nutrition education.  As evidenced by lack of understanding of how her food choices affect her health, and Inability to identify protein foods.    Intervention:  Nutrition Education.  Counseled pt on the relationship between her mental, emotional, and physical health.  Discuss potential stress relief activities, including meditation videos or audio. Explain body composition to pt and what the different values reflect in terms of health. Encourage pt to make appointments with kidney specialist at [facility], as well as seeking a mental health provider covered by her insurance. Educate pt on identifying protein sources and the importance of consistency of intake, and the impact of over consumption on kidney health. Educate pt on a balanced plate, mindful eating, and consistent intake throughout the day.  Dietitian to discuss at next visit: Sugar consumption (naked drinks)  updates on mental health provider and renal specialist  Assess protein consumption  Goals/Objectives: Look for meditation videos on Netflix!  Look into mental health providers that are covered by your insurance, and choose the one that you feel best working with. Follow up with [facility] to set up an appointment! Remember what health is! It is not weight! [employer], [employer], [employer]. Follow the MyPlacemat eating model.  Work towards consistency  Eat within 1 hour of waking  Eat every 5 hours  Don't eat within 2-3  hours of laying down for bed.  Teaching Method Utilized:  Visual Auditory   Handouts given during visit include:  MyPlacemat for Diabetes  Body Composition printout  Barriers to learning/adherence to lifestyle change: Not connecting with kidney specialist, and/or mental health  provider.  Demonstrated degree of understanding via:  Teach Back   Monitoring/Evaluation:  Dietary intake, exercise, psychosocial factors, and body weight in 1 month(s).

## 2020-07-11 ENCOUNTER — Encounter: Payer: Self-pay | Admitting: Emergency Medicine

## 2020-07-11 ENCOUNTER — Ambulatory Visit (INDEPENDENT_AMBULATORY_CARE_PROVIDER_SITE_OTHER): Payer: 59

## 2020-07-11 ENCOUNTER — Other Ambulatory Visit: Payer: Self-pay

## 2020-07-11 ENCOUNTER — Ambulatory Visit
Admission: EM | Admit: 2020-07-11 | Discharge: 2020-07-11 | Disposition: A | Discharge status: Home / Self Care | Payer: Self-pay | Attending: Emergency Medicine | Admitting: Emergency Medicine

## 2020-07-11 DIAGNOSIS — M25531 Pain in right wrist: Secondary | ICD-10-CM

## 2020-07-11 DIAGNOSIS — N946 Dysmenorrhea, unspecified: Secondary | ICD-10-CM

## 2020-07-11 MED ORDER — NAPROXEN 500 MG PO TABS: 500.0000 mg | ORAL_TABLET | Freq: Two times a day (BID) | ORAL | 0 refills | Status: DC

## 2020-07-11 NOTE — Discharge Instructions (Addendum)
RICE: rest, ice, compression, elevation as needed for pain.    Heat therapy (hot compress, warm wash rag, hot showers, etc.) can help relax muscles and soothe muscle aches. Cold therapy (ice packs) can be used to help swelling both after injury and after prolonged use of areas of chronic pain/aches.  Pain medication:  350 mg-1000 mg of Tylenol (acetaminophen) and/or 200 mg - 800 mg of Advil (ibuprofen, Motrin) every 8 hours as needed.  May alternate between the two throughout the day as they are generally safe to take together.  DO NOT exceed more than 3000 mg of Tylenol or 3200 mg of ibuprofen in a 24 hour period as this could damage your stomach, kidneys, liver, or increase your bleeding risk.   Important to follow up with specialist(s) below for further evaluation/management if your symptoms persist or worsen. 

## 2020-07-11 NOTE — ED Provider Notes (Signed)
[name]    CSN: [id number] Arrival date & time: 07/11/20  1700      History   Chief Complaint Chief Complaint  Patient presents with  . Abdominal Pain  . Arm Problem    HPI Kelsey Ramsey is a 24 y.Kelsey. female presenting for multiple complaints. Patient nursing right wrist pain, medial aspect, since Monday.  No trauma, inciting event.  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  No numbness or deformity, discoloration, though does have extreme difficulty moving it as she is "scared to hurt more ". Also notes menstrual cramping.  States she is dealt with this "for years ".  And her "boss does not understand ".  Currently on her cycle: Denies heavier bleeding than normal, nausea, vomiting.  No urinary symptoms.    History reviewed. No pertinent past medical history.  There are no problems to display for this patient.   History reviewed. No pertinent surgical history.  OB History   No obstetric history on file.      Home Medications    Prior to Admission medications   Medication Sig Start Date End Date Taking? Authorizing Provider  naproxen (NAPROSYN) 500 MG tablet Take 1 tablet (500 mg total) by mouth 2 (two) times daily. 07/11/20   Hall-Potvin, [place], PA-C    Family History History reviewed. No pertinent family history.  Social History Social History   Tobacco Use  . Smoking status: Not on file  Substance Use Topics  . Alcohol use: Not on file  . Drug use: Not on file     Allergies   Patient has no known allergies.   Review of Systems As per HPI   Physical Exam Triage Vital Signs ED Triage Vitals  Enc Vitals Group     BP      Pulse      Resp      Temp      Temp src      SpO2      Weight      Height      Head Circumference      Peak Flow      Pain Score      Pain Loc      Pain Edu?      Excl. in GC?    No data found.  Updated Vital Signs BP (!) 157/103 (BP Location: Left Arm)    Pulse 77   Temp 98.3 F (36.8 C) (Oral)   Resp 18   LMP 07/11/2020   SpO2 98%   Visual Acuity Right Eye Distance:   Left Eye Distance:   Bilateral Distance:    Right Eye Near:   Left Eye Near:    Bilateral Near:     Physical Exam Constitutional:      General: She is not in acute distress. HENT:     Head: Normocephalic and atraumatic.  Eyes:     General: No scleral icterus.    Pupils: Pupils are equal, round, and reactive to light.  Cardiovascular:     Rate and Rhythm: Normal rate.  Pulmonary:     Effort: Pulmonary effort is normal.  Musculoskeletal:     Comments: Patient with exquisite tenderness over ulnar head without deformity, swelling, crepitus or erythema.  Exam limited second patient cooperation due to pain.  NVI  Skin:    Coloration: Skin is not jaundiced or pale.  Neurological:     Mental Status: She is alert  and oriented to person, place, and time.      UC Treatments / Results  Labs (all labs ordered are listed, but only abnormal results are displayed) Labs Reviewed - No data to display  EKG   Radiology DG Wrist Complete Right  Result Date: 07/11/2020 CLINICAL DATA:  Wrist pain EXAM: RIGHT WRIST - COMPLETE 3+ VIEW COMPARISON:  None. FINDINGS: There is no evidence of fracture or dislocation. There is no evidence of arthropathy or other focal bone abnormality. Soft tissues are unremarkable. IMPRESSION: Negative. Electronically Signed   By: Jasmine Pang M.D.   On: 07/11/2020 19:23    Procedures Procedures (including critical care time)  Medications Ordered in UC Medications - No data to display  Initial Impression / Assessment and Plan / UC Course  I have reviewed the triage vital signs and the nursing notes.  Pertinent labs & imaging results that were available during my care of the patient were reviewed by me and considered in my medical decision making (see chart for details).     X-ray done in office and reviewed by me radiology: Negative.   Treat supportively as outlined below.  Read a follow-up information for orthopedics. Regarding muscle cramps, encourage patient to follow-up with her GYN for further evaluation management and chronic/repeat work notes.  Will trial Mobic in the interim. Final Clinical Impressions(s) / UC Diagnoses   Final diagnoses:  Right wrist pain  Dysmenorrhea     Discharge Instructions     RICE: rest, ice, compression, elevation as needed for pain.    Heat therapy (hot compress, warm wash rag, hot showers, etc.) can help relax muscles and soothe muscle aches. Cold therapy (ice packs) can be used to help swelling both after injury and after prolonged use of areas of chronic pain/aches.  Pain medication:  350 mg-1000 mg of Tylenol (acetaminophen) and/or 200 mg - 800 mg of Advil (ibuprofen, Motrin) every 8 hours as needed.  May alternate between the two throughout the day as they are generally safe to take together.  DO NOT exceed more than 3000 mg of Tylenol or 3200 mg of ibuprofen in a 24 hour period as this could damage your stomach, kidneys, liver, or increase your bleeding risk.  Important to follow up with specialist(s) below for further evaluation/management if your symptoms persist or worsen.    ED Prescriptions    Medication Sig Dispense Auth. Provider   naproxen (NAPROSYN) 500 MG tablet Take 1 tablet (500 mg total) by mouth 2 (two) times daily. 30 tablet Hall-Potvin, [place], PA-C     PDMP not reviewed this encounter.   Odette Fraction Dwight, [place] 07/11/20 1944

## 2020-07-11 NOTE — ED Triage Notes (Addendum)
Right wrist pain started on Monday, pain in right wrist, patient is right handed.  No injury Patient woke with pain in right wrist  Complains of abdominal pain.  Patient reports period started today.  Abdominal pain interfering with work.  Boss suggests getting a note.  Patient was seen by provider august 4.  Patient understanding of instructions Margarette Asal is confusing

## 2020-07-23 ENCOUNTER — Encounter: Payer: Self-pay | Admitting: Skilled Nursing Facility1

## 2020-10-31 ENCOUNTER — Ambulatory Visit: Payer: 59

## 2020-11-04 ENCOUNTER — Ambulatory Visit: Payer: 59

## 2020-12-24 ENCOUNTER — Other Ambulatory Visit: Payer: Self-pay

## 2020-12-24 ENCOUNTER — Encounter (HOSPITAL_COMMUNITY): Payer: Self-pay

## 2020-12-24 ENCOUNTER — Ambulatory Visit (HOSPITAL_COMMUNITY)
Admission: EM | Admit: 2020-12-24 | Discharge: 2020-12-24 | Disposition: A | Discharge status: Home / Self Care | Payer: 59 | Attending: Family Medicine | Admitting: Family Medicine

## 2020-12-24 DIAGNOSIS — Z113 Encounter for screening for infections with a predominantly sexual mode of transmission: Secondary | ICD-10-CM | POA: Diagnosis present

## 2020-12-24 LAB — HIV ANTIBODY (ROUTINE TESTING W REFLEX): HIV Screen 4th Generation wRfx: NONREACTIVE

## 2020-12-24 NOTE — ED Provider Notes (Signed)
[facility]    CSN: [id number] Arrival date & time: 12/24/20  1429      History   Chief Complaint No chief complaint on file.   HPI Kelsey Ramsey is a 25 y.o. female.   25 year old female presents today for STD screening.  Reports sexual partner told her that she needs to be tested due to him testing positive.  She is unsure of which STD he tested positive for.  He did not disclose this information.  She is currently having no symptoms.  Denies any vaginal discharge, itching, irritation, dysuria, hematuria or urinary frequency.  No abdominal pain, pelvic pain, back pain or fevers. Patient's last menstrual period was 11/26/2020 (exact date).      Past Medical History:  Diagnosis Date  . Hypertension   . Nephritic syndrome   . Nephrotic syndrome     Patient Active Problem List   Diagnosis Date Noted  . Contraception management 12/28/2018  . 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 Nephrotic syndrome, focal and segmental glomerular lesions 04/06/2016  . Renal hypertension 04/06/2016  . Subcutaneous mass 09/27/2015  . Chronic kidney disease 08/10/2014  . Chronic kidney disease (CKD) stage G3a/A2, moderately decreased glomerular filtration rate (GFR) between 45-59 mL/min/1.73 square meter and albuminuria creatinine ratio between 30-299 mg/g (HCC) 08/10/2014  . Therapeutic drug monitoring 05/12/2012  . History of TB (tuberculosis) 02/11/2012    Past Surgical History:  Procedure Laterality Date  . RENAL BIOPSY      OB History   No obstetric history on file.      Home Medications    Prior to Admission medications   Medication Sig Start Date End Date Taking? Authorizing Provider  losartan (COZAAR) 50 MG tablet Take 50 mg by mouth daily. 12/17/20   [provider]  medroxyPROGESTERone (DEPO-PROVERA) 150 MG/ML injection Inject 1 mL (150 mg total) into the  muscle every 3 (three) months. Patient not taking: Reported on 09/26/2019 12/28/18 12/06/19  Hermina Staggers, MD    Family History Family History  Problem Relation Age of Onset  . Diabetes Paternal Grandfather   . Stroke Maternal Grandmother   . Cancer Maternal Grandfather   . Diabetes Maternal Grandfather   . Hypertension Mother   . Heart disease Mother   . Thyroid disease Mother   . Kidney Stones Maternal Uncle     Social History Social History   Tobacco Use  . Smoking status: Current Some Day Smoker    Types: Cigars  . Smokeless tobacco: Never Used  . Tobacco comment: rare  Vaping Use  . Vaping Use: Never used  Substance Use Topics  . Alcohol use: Yes    Comment: socially  . Drug use: No     Allergies   Pineapple   Review of Systems Review of Systems   Physical Exam Triage Vital Signs ED Triage Vitals  Enc Vitals Group     BP 12/24/20 1503 135/82     Pulse Rate 12/24/20 1503 96     Resp 12/24/20 1503 19     Temp 12/24/20 1503 98.9 F (37.2 C)     Temp Source 12/24/20 1503 Oral     SpO2 12/24/20 1503 100 %     Weight --      Height --      Head Circumference --      Peak Flow --      Pain Score 12/24/20  1501 0     Pain Loc --      Pain Edu? --      Excl. in GC? --    No data found.  Updated Vital Signs BP 135/82 (BP Location: Left Arm)   Pulse 96   Temp 98.9 F (37.2 C) (Oral)   Resp 19   LMP 11/26/2020 (Exact Date)   SpO2 100%   Visual Acuity Right Eye Distance:   Left Eye Distance:   Bilateral Distance:    Right Eye Near:   Left Eye Near:    Bilateral Near:     Physical Exam Vitals and nursing note reviewed.  Constitutional:      General: She is not in acute distress.    Appearance: Normal appearance. She is not ill-appearing, toxic-appearing or diaphoretic.  HENT:     Head: Normocephalic.  Eyes:     Conjunctiva/sclera: Conjunctivae normal.  Pulmonary:     Effort: Pulmonary effort is normal.  Musculoskeletal:         General: Normal range of motion.     Cervical back: Normal range of motion.  Skin:    General: Skin is warm and dry.     Findings: No rash.  Neurological:     Mental Status: She is alert.  Psychiatric:        Mood and Affect: Mood normal.      UC Treatments / Results  Labs (all labs ordered are listed, but only abnormal results are displayed) Labs Reviewed  HIV ANTIBODY (ROUTINE TESTING W REFLEX)  RPR  CERVICOVAGINAL ANCILLARY ONLY    EKG   Radiology No results found.  Procedures Procedures (including critical care time)  Medications Ordered in UC Medications - No data to display  Initial Impression / Assessment and Plan / UC Course  I have reviewed the triage vital signs and the nursing notes.  Pertinent labs & imaging results that were available during my care of the patient were reviewed by me and considered in my medical decision making (see chart for details).     STD screening Swab sent for STD screening. Blood work drawn for HIV and syphilis. Follow up as needed for continued or worsening symptoms  Final Clinical Impressions(s) / UC Diagnoses   Final diagnoses:  Screening for STD (sexually transmitted disease)     Discharge Instructions     Swab sent for STD screening We will  call with any positive results.     ED Prescriptions    None     PDMP not reviewed this encounter.   Janace Aris, NP 12/24/20 1529

## 2020-12-24 NOTE — Discharge Instructions (Addendum)
Swab sent for STD screening We will  call with any positive results.

## 2020-12-24 NOTE — ED Triage Notes (Signed)
Pt presents for STD;s testing. States sexual partner told her to be tested, as he have an STD but he did not tell the pt which one. Pt denies dysuria, vaginal discharge, abdominal pain, fever.

## 2020-12-25 LAB — CERVICOVAGINAL ANCILLARY ONLY
Bacterial Vaginitis (gardnerella): POSITIVE — AB
Candida Glabrata: NEGATIVE
Candida Vaginitis: NEGATIVE
Chlamydia: NEGATIVE
Comment: NEGATIVE
Comment: NEGATIVE
Comment: NEGATIVE
Comment: NEGATIVE
Comment: NEGATIVE
Comment: NORMAL
Neisseria Gonorrhea: NEGATIVE
Trichomonas: NEGATIVE

## 2020-12-25 LAB — RPR: RPR Ser Ql: NONREACTIVE

## 2021-06-05 ENCOUNTER — Ambulatory Visit (HOSPITAL_COMMUNITY)
Admission: EM | Admit: 2021-06-05 | Discharge: 2021-06-05 | Disposition: A | Discharge status: Home / Self Care | Payer: 59 | Attending: Emergency Medicine | Admitting: Emergency Medicine

## 2021-06-05 ENCOUNTER — Other Ambulatory Visit: Payer: Self-pay

## 2021-06-05 ENCOUNTER — Encounter (HOSPITAL_COMMUNITY): Payer: Self-pay | Admitting: *Deleted

## 2021-06-05 DIAGNOSIS — J020 Streptococcal pharyngitis: Secondary | ICD-10-CM | POA: Diagnosis not present

## 2021-06-05 LAB — POCT RAPID STREP A, ED / UC: Streptococcus, Group A Screen (Direct): POSITIVE — AB

## 2021-06-05 MED ORDER — METHYLPREDNISOLONE SODIUM SUCC 125 MG IJ SOLR
60.0000 mg | Freq: Once | INTRAMUSCULAR | Status: AC
  Administered 2021-06-05: 60 mg via INTRAMUSCULAR

## 2021-06-05 MED ORDER — METHYLPREDNISOLONE SODIUM SUCC 125 MG IJ SOLR
INTRAMUSCULAR | Status: AC
  Filled 2021-06-05: qty 2

## 2021-06-05 MED ORDER — AMOXICILLIN 500 MG PO CAPS: 500.0000 mg | ORAL_CAPSULE | Freq: Two times a day (BID) | ORAL | 0 refills | Status: DC

## 2021-06-05 NOTE — ED Triage Notes (Signed)
Pt reports swelling and pain in her throat for 3 weeks.

## 2021-06-05 NOTE — ED Triage Notes (Signed)
TC to number listed in Pt chart no answer. Pt called in front lobby no answer.

## 2021-06-05 NOTE — Discharge Instructions (Addendum)
Your strep test was positive   Take amoxicillin twice a day for the next 10 days   If having difficulty swallowing or feeling you can breath go to nearest emergency department for evaluation  If unable to tolerate foods increase fluid intake using gatorade or similar product to maintain hydration

## 2021-06-09 NOTE — ED Provider Notes (Signed)
[facility]    CSN: [id number] Arrival date & time: 06/05/21  1805      History   Chief Complaint Chief Complaint  Patient presents with   Sore Throat    HPI Kelsey Ramsey is a 25 y.o. female.   Patient presents with swelling and painful tonsils intermittently for 2-3 weeks. Painful to swallow, had one episode last night with difficulty breathing. Denies difficulty breathing at this time. Unable to tolerate food but can tolerate small amounts of fluid. Denies fever, chills, URI symptoms. Attempted salt water gargles, throat lozenges, otc pain medications with no relief.    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      Home Medications    Prior to Admission medications   Medication Sig Start Date End Date Taking? Authorizing Provider  amoxicillin (AMOXIL) 500 MG capsule Take 1 capsule (500 mg total) by mouth 2 (two) times daily. 06/05/21  Yes Yaqueline Gutter R, NP  losartan (COZAAR) 50 MG tablet Take 50 mg by mouth daily. 12/17/20   [provider]  medroxyPROGESTERone (DEPO-PROVERA) 150 MG/ML  injection Inject 1 mL (150 mg total) into the muscle every 3 (three) months. Patient not taking: Reported on 09/26/2019 12/28/18 12/06/19  Hermina Staggers, MD    Family History Family History  Problem Relation Age of Onset   Diabetes Paternal Grandfather    Stroke Maternal Grandmother    Cancer Maternal Grandfather    Diabetes Maternal Grandfather    Hypertension Mother    Heart disease Mother    Thyroid disease Mother    Kidney Stones Maternal Uncle     Social History Social History   Tobacco Use   Smoking status: Some Days    Types: Cigars   Smokeless tobacco: Never   Tobacco comments:    rare  Vaping Use   Vaping Use: Never used  Substance Use Topics   Alcohol use: Yes    Comment: socially   Drug use: No     Allergies   Pineapple   Review of Systems Review of Systems  Constitutional: Negative.   HENT:  Positive for sore throat. Negative for congestion, dental problem, drooling, ear discharge, ear pain, facial swelling, hearing loss, mouth sores, nosebleeds, postnasal drip, rhinorrhea, sinus pressure, sinus pain, sneezing, tinnitus, trouble swallowing and voice change.   Respiratory: Negative.    Cardiovascular: Negative.     Physical Exam Triage Vital Signs ED Triage Vitals  Enc Vitals Group     BP 06/05/21 1934 133/83     Pulse Rate 06/05/21 1934 88     Resp 06/05/21 1934 20     Temp 06/05/21 1934  99.1 F (37.3 C)     Temp src --      SpO2 06/05/21 1934 100 %     Weight --      Height --      Head Circumference --      Peak Flow --      Pain Score 06/05/21 1936 10     Pain Loc --      Pain Edu? --      Excl. in GC? --    No data found.  Updated Vital Signs BP 133/83   Pulse 88   Temp 99.1 F (37.3 C)   Resp 20   LMP 05/24/2021   SpO2 100%   Visual Acuity Right Eye Distance:   Left Eye Distance:   Bilateral Distance:    Right Eye Near:   Left Eye Near:    Bilateral Near:     Physical Exam Constitutional:      Appearance: She is  well-developed and normal weight.  HENT:     Head: Normocephalic.     Right Ear: Tympanic membrane and ear canal normal.     Left Ear: Tympanic membrane and ear canal normal.     Nose: No congestion or rhinorrhea.     Mouth/Throat:     Mouth: Mucous membranes are moist.     Pharynx: Uvula midline. Posterior oropharyngeal erythema present. No oropharyngeal exudate.     Tonsils: No tonsillar exudate. 3+ on the right. 3+ on the left.  Pulmonary:     Effort: Pulmonary effort is normal.  Musculoskeletal:     Cervical back: Normal range of motion.  Lymphadenopathy:     Cervical: Cervical adenopathy present.  Skin:    General: Skin is warm and dry.  Neurological:     Mental Status: She is alert and oriented to person, place, and time.  Psychiatric:        Mood and Affect: Mood normal.        Behavior: Behavior normal.     UC Treatments / Results  Labs (all labs ordered are listed, but only abnormal results are displayed) Labs Reviewed  POCT RAPID STREP A, ED / UC - Abnormal; Notable for the following components:      Result Value   Streptococcus, Group A Screen (Direct) POSITIVE (*)    All other components within normal limits    EKG   Radiology No results found.  Procedures Procedures (including critical care time)  Medications Ordered in UC Medications  methylPREDNISolone sodium succinate (SOLU-MEDROL) 125 mg/2 mL injection 60 mg (60 mg Intramuscular Given 06/05/21 2038)    Initial Impression / Assessment and Plan / UC Course  I have reviewed the triage vital signs and the nursing notes.  Pertinent labs & imaging results that were available during my care of the patient were reviewed by me and considered in my medical decision making (see chart for details).  Strep pharyngitis  Rapid strep positive Amoxicillin 500 mg bid for 10 days Methylprednisolone 60 mg IM now  Return precautions given for difficulty breathing to go nearest emergency department  Final  Clinical Impressions(s) / UC Diagnoses   Final diagnoses:  Strep pharyngitis     Discharge Instructions      Your strep test was positive   Take amoxicillin twice a day for the next 10 days   If having difficulty swallowing or feeling you can breath go to nearest emergency department for evaluation  If unable to tolerate foods increase fluid intake  using gatorade or similar product to maintain hydration    ED Prescriptions     Medication Sig Dispense Auth. Provider   amoxicillin (AMOXIL) 500 MG capsule Take 1 capsule (500 mg total) by mouth 2 (two) times daily. 20 capsule Valinda Hoar, NP      PDMP not reviewed this encounter.   Valinda Hoar, [place] 06/09/21 [phone]

## 2021-06-27 ENCOUNTER — Ambulatory Visit (INDEPENDENT_AMBULATORY_CARE_PROVIDER_SITE_OTHER): Payer: 59 | Admitting: *Deleted

## 2021-06-27 ENCOUNTER — Other Ambulatory Visit: Payer: Self-pay

## 2021-06-27 ENCOUNTER — Other Ambulatory Visit (HOSPITAL_COMMUNITY)
Admission: RE | Admit: 2021-06-27 | Discharge: 2021-06-27 | Disposition: A | Discharge status: Home / Self Care | Payer: 59 | Source: Ambulatory Visit | Attending: Obstetrics and Gynecology | Admitting: Obstetrics and Gynecology

## 2021-06-27 ENCOUNTER — Encounter: Payer: Self-pay | Admitting: Obstetrics

## 2021-06-27 DIAGNOSIS — A749 Chlamydial infection, unspecified: Secondary | ICD-10-CM | POA: Insufficient documentation

## 2021-06-27 DIAGNOSIS — Z202 Contact with and (suspected) exposure to infections with a predominantly sexual mode of transmission: Secondary | ICD-10-CM

## 2021-06-27 HISTORY — DX: Chlamydial infection, unspecified: A74.9

## 2021-06-27 NOTE — Progress Notes (Signed)
SUBJECTIVE:  25 y.o. female who desires a STI screen. Denies abnormal vaginal discharge, bleeding or significant pelvic pain. No UTI symptoms. Reports possible exposure to STD. Reports vaginal swelling and irritation.   No LMP recorded.  OBJECTIVE:  She appears well.   ASSESSMENT:  STI Screen   PLAN:  Pt offered STI blood screening-GC, chlamydia, and trichomonas probe sent to lab.  Treatment: To be determined once lab results are received.  Pt follow up as needed.

## 2021-06-29 ENCOUNTER — Encounter: Payer: Self-pay | Admitting: Obstetrics & Gynecology

## 2021-06-29 ENCOUNTER — Other Ambulatory Visit: Payer: Self-pay | Admitting: Obstetrics & Gynecology

## 2021-06-29 DIAGNOSIS — Z113 Encounter for screening for infections with a predominantly sexual mode of transmission: Secondary | ICD-10-CM

## 2021-06-29 DIAGNOSIS — N760 Acute vaginitis: Secondary | ICD-10-CM

## 2021-06-29 DIAGNOSIS — B9689 Other specified bacterial agents as the cause of diseases classified elsewhere: Secondary | ICD-10-CM

## 2021-06-29 DIAGNOSIS — A749 Chlamydial infection, unspecified: Secondary | ICD-10-CM

## 2021-06-29 LAB — CERVICOVAGINAL ANCILLARY ONLY
Bacterial Vaginitis (gardnerella): POSITIVE — AB
Candida Glabrata: NEGATIVE
Candida Vaginitis: NEGATIVE
Chlamydia: POSITIVE — AB
Comment: NEGATIVE
Comment: NEGATIVE
Comment: NEGATIVE
Comment: NEGATIVE
Comment: NEGATIVE
Comment: NORMAL
Neisseria Gonorrhea: NEGATIVE
Trichomonas: NEGATIVE

## 2021-06-29 MED ORDER — DOXYCYCLINE HYCLATE 100 MG PO CAPS: 100.0000 mg | ORAL_CAPSULE | Freq: Two times a day (BID) | ORAL | 1 refills | Status: AC

## 2021-06-29 MED ORDER — FLUCONAZOLE 150 MG PO TABS: 150.0000 mg | ORAL_TABLET | Freq: Once | ORAL | 3 refills | Status: AC

## 2021-06-29 MED ORDER — METRONIDAZOLE 500 MG PO TABS: 500.0000 mg | ORAL_TABLET | Freq: Two times a day (BID) | ORAL | 1 refills | Status: AC

## 2021-06-29 NOTE — Progress Notes (Signed)
Vaginal discharge test is abnormal and showed chlamydia and bacterial vaginitis. Patient was called by phone, and results were discussed with her.   Recommended testing for other STIs (future order for serum testing placed), also needs to let partner(s) know so the partner(s) can get testing and treatment. Patient and sex partner(s) should abstain from unprotected sexual activity for seven days after everyone receives appropriate treatment.  Doxycycline and Metronidazole prescribed for patient; she was advised to pick up medicine and take as directed.  Patient can return in about 4 weeks after treatment for repeat test of cure.  Advised patient to practice safe sex at all times.  Patient needs lab appointment to get the serum testing, order in place  Kelsey Collins, MD

## 2021-07-04 ENCOUNTER — Other Ambulatory Visit: Payer: 59

## 2021-07-04 ENCOUNTER — Ambulatory Visit: Payer: 59

## 2021-07-10 ENCOUNTER — Ambulatory Visit: Payer: 59

## 2021-07-18 ENCOUNTER — Encounter: Payer: Self-pay | Admitting: Obstetrics and Gynecology

## 2021-07-18 ENCOUNTER — Other Ambulatory Visit: Payer: Self-pay

## 2021-07-18 ENCOUNTER — Other Ambulatory Visit (HOSPITAL_COMMUNITY)
Admission: RE | Admit: 2021-07-18 | Discharge: 2021-07-18 | Disposition: A | Discharge status: Home / Self Care | Payer: 59 | Source: Ambulatory Visit | Attending: Obstetrics and Gynecology | Admitting: Obstetrics and Gynecology

## 2021-07-18 ENCOUNTER — Ambulatory Visit (INDEPENDENT_AMBULATORY_CARE_PROVIDER_SITE_OTHER): Payer: 59

## 2021-07-18 DIAGNOSIS — Z113 Encounter for screening for infections with a predominantly sexual mode of transmission: Secondary | ICD-10-CM | POA: Diagnosis present

## 2021-07-18 DIAGNOSIS — N898 Other specified noninflammatory disorders of vagina: Secondary | ICD-10-CM | POA: Diagnosis not present

## 2021-07-18 DIAGNOSIS — R300 Dysuria: Secondary | ICD-10-CM

## 2021-07-18 LAB — POCT URINALYSIS DIPSTICK
Bilirubin, UA: NEGATIVE
Glucose, UA: NEGATIVE
Ketones, UA: NEGATIVE
Leukocytes, UA: NEGATIVE
Nitrite, UA: NEGATIVE
Odor: NEGATIVE
Protein, UA: NEGATIVE
Spec Grav, UA: 1.015 (ref 1.010–1.025)
Urobilinogen, UA: 0.2 E.U./dL
pH, UA: 7 (ref 5.0–8.0)

## 2021-07-18 NOTE — Progress Notes (Signed)
Patient was assessed and managed by nursing staff during this encounter. I have reviewed the chart and agree with the documentation and plan. I have also made any necessary editorial changes.  Andrae Claunch, MD 07/18/2021 11:54 AM   

## 2021-07-18 NOTE — Progress Notes (Signed)
SUBJECTIVE:  24 y.o. female requests all STD testing vaginal and orally. She complains of white vaginal discharge with irritation and urinary retention. Denies abnormal vaginal bleeding or significant pelvic pain or fever.  OBJECTIVE:  She appears well, afebrile. Urine dipstick: positive for RBC's  ASSESSMENT:  Vaginal Discharge    PLAN:  Urine culture, GC, chlamydia, trichomonas, BVAG, CVAG probe sent to lab. Treatment: To be determined once lab results are reviewed by the provider. ROV prn if symptoms persist or worsen.

## 2021-07-19 LAB — HEPATITIS C ANTIBODY: Hep C Virus Ab: <0.1 s/co ratio (ref 0.0–0.9)

## 2021-07-19 LAB — RPR: RPR Ser Ql: NONREACTIVE

## 2021-07-19 LAB — HIV ANTIBODY (ROUTINE TESTING W REFLEX): HIV Screen 4th Generation wRfx: NONREACTIVE

## 2021-07-19 LAB — HEPATITIS B SURFACE ANTIGEN: Hepatitis B Surface Ag: NEGATIVE

## 2021-07-20 LAB — URINE CULTURE

## 2021-07-21 LAB — CERVICOVAGINAL ANCILLARY ONLY
Bacterial Vaginitis (gardnerella): POSITIVE — AB
Candida Glabrata: NEGATIVE
Candida Vaginitis: POSITIVE — AB
Chlamydia: NEGATIVE
Chlamydia: NEGATIVE
Comment: NEGATIVE
Comment: NEGATIVE
Comment: NEGATIVE
Comment: NEGATIVE
Comment: NEGATIVE
Comment: NORMAL
Comment: NEGATIVE
Comment: NEGATIVE
Comment: NORMAL
Neisseria Gonorrhea: NEGATIVE
Neisseria Gonorrhea: NEGATIVE
Trichomonas: NEGATIVE
Trichomonas: NEGATIVE

## 2021-07-22 MED ORDER — TERCONAZOLE 0.8 % VA CREA: 1.0000 | TOPICAL_CREAM | Freq: Every day | VAGINAL | 0 refills | Status: DC

## 2021-07-22 MED ORDER — METRONIDAZOLE 500 MG PO TABS: 500.0000 mg | ORAL_TABLET | Freq: Two times a day (BID) | ORAL | 0 refills | Status: DC

## 2021-07-22 NOTE — Addendum Note (Signed)
Addended by: Catalina Antigua on: 07/22/2021 09:47 AM   Modules accepted: Orders

## 2021-08-05 ENCOUNTER — Ambulatory Visit (HOSPITAL_COMMUNITY)
Admission: EM | Admit: 2021-08-05 | Discharge: 2021-08-05 | Disposition: A | Discharge status: Home / Self Care | Payer: 59 | Attending: Emergency Medicine | Admitting: Emergency Medicine

## 2021-08-05 ENCOUNTER — Other Ambulatory Visit: Payer: Self-pay

## 2021-08-05 ENCOUNTER — Encounter (HOSPITAL_COMMUNITY): Payer: Self-pay | Admitting: Emergency Medicine

## 2021-08-05 DIAGNOSIS — N049 Nephrotic syndrome with unspecified morphologic changes: Secondary | ICD-10-CM

## 2021-08-05 DIAGNOSIS — B372 Candidiasis of skin and nail: Secondary | ICD-10-CM

## 2021-08-05 DIAGNOSIS — F341 Dysthymic disorder: Secondary | ICD-10-CM | POA: Diagnosis not present

## 2021-08-05 DIAGNOSIS — L509 Urticaria, unspecified: Secondary | ICD-10-CM | POA: Diagnosis not present

## 2021-08-05 MED ORDER — ESCITALOPRAM OXALATE 10 MG PO TABS: 10.0000 mg | ORAL_TABLET | Freq: Every day | ORAL | 0 refills | Status: DC

## 2021-08-05 MED ORDER — LOSARTAN POTASSIUM 50 MG PO TABS: 50.0000 mg | ORAL_TABLET | Freq: Every day | ORAL | 0 refills | Status: DC

## 2021-08-05 MED ORDER — NYSTATIN 100000 UNIT/GM EX CREA: 1.0000 "application " | TOPICAL_CREAM | Freq: Two times a day (BID) | CUTANEOUS | 0 refills | Status: AC

## 2021-08-05 MED ORDER — MONTELUKAST SODIUM 10 MG PO TABS: 10.0000 mg | ORAL_TABLET | Freq: Every day | ORAL | 0 refills | Status: DC

## 2021-08-05 MED ORDER — METHYLPREDNISOLONE SODIUM SUCC 125 MG IJ SOLR
INTRAMUSCULAR | Status: AC
  Filled 2021-08-05: qty 2

## 2021-08-05 MED ORDER — METHYLPREDNISOLONE SODIUM SUCC 125 MG IJ SOLR
125.0000 mg | Freq: Once | INTRAMUSCULAR | Status: AC
  Administered 2021-08-05: 125 mg via INTRAMUSCULAR

## 2021-08-05 NOTE — ED Triage Notes (Signed)
Pt presents with rash/ hives and itching that started last night. States rash is on neck, back and under breast.

## 2021-08-05 NOTE — ED Provider Notes (Signed)
[facility]    CSN: [id number] Arrival date & time: 08/05/21  9798      History   Chief Complaint Chief Complaint  Patient presents with   Urticaria    HPI Kelsey Ramsey is a 25 y.o. female.   Patient complains of rash on neck upper chest beneath breasts and back that comes and goes, is intensely itchy, usually last about 5 hours before completely resolving without intervention.  States that even after rash goes away, the itchiness remains.  Patient states this has been going on for "a while", unable to specify for how long.  Patient reports recently being under a lot of stress.  Patient also reports a history of pulling her hair when she feels particularly depressed, pulls her eyebrows, eyelashes and the hair at the back of her head.  Patient's EMR reviewed.  Patient has a history of nephrotic syndrome, most recently seen by nephrology on December 12, 2020.  At the time she was stating that she did not want to take the losartan because she plans to become pregnant in the next 2 years and was concerned about losartan being contraindicated in pregnancy.  Patient's blood pressure is again elevated today.  Patient states she continues to have a rash beneath both breasts that feels very itchy as well.  Patient states that she reports feeling depressed since she was 11.  Patient states she has had 6 family members die recently and has difficulty when they are "death dates" come around.  Patient is tearful during visit today.  Patient states she has been diagnosed with dysthymia in the past and has been prescribed medication for this however states she is never taken the medication and she is not willing to "own" a diagnosis of depression, states she has been in therapy in the past but does not feel that it was helpful.  Patient does not endorse suicidal thoughts or ideation at this time.  The history is provided by the patient.  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      Home Medications    Prior to Admission medications   Medication Sig Start Date End Date Taking? Authorizing Provider  escitalopram (LEXAPRO) 10 MG tablet Take 1 tablet (10 mg total) by mouth daily. 08/05/21 09/04/21 Yes Theadora Rama Scales, PA-C  montelukast (SINGULAIR) 10 MG tablet Take 1 tablet (10 mg total) by mouth at bedtime. 08/05/21 09/04/21 Yes Theadora Rama Scales, PA-C  nystatin cream (MYCOSTATIN) Apply 1 application topically 2 (two) times daily. 08/05/21  09/04/21 Yes Theadora Rama Scales, PA-C  losartan (COZAAR) 50 MG tablet Take 1 tablet (50 mg total) by mouth daily. 08/05/21 09/04/21  Theadora Rama Scales, PA-C  medroxyPROGESTERone (DEPO-PROVERA) 150 MG/ML injection Inject 1 mL (150 mg total) into the muscle every 3 (three) months. Patient not taking: Reported on 09/26/2019 12/28/18 12/06/19  Hermina Staggers, MD    Family History Family History  Problem Relation Age of Onset   Diabetes Paternal  Grandfather    Stroke Maternal Grandmother    Cancer Maternal Grandfather    Diabetes Maternal Grandfather    Hypertension Mother    Heart disease Mother    Thyroid disease Mother    Kidney Stones Maternal Uncle     Social History Social History   Tobacco Use   Smoking status: Some Days    Types: Cigars   Smokeless tobacco: Never   Tobacco comments:    rare  Vaping Use   Vaping Use: Never used  Substance Use Topics   Alcohol use: Yes    Comment: socially   Drug use: No     Allergies   Pineapple   Review of Systems Review of Systems Per HPI  Physical Exam Triage Vital Signs ED Triage Vitals  Enc Vitals Group     BP 08/05/21 0850 (!) 139/92     Pulse Rate 08/05/21 0850 78     Resp 08/05/21 0850 16     Temp 08/05/21 0850 98.4 F (36.9 C)     Temp Source 08/05/21 0850 Oral     SpO2 08/05/21 0850 98 %     Weight --      Height --      Head Circumference --      Peak Flow --      Pain Score 08/05/21 0845 0     Pain Loc --      Pain Edu? --      Excl. in GC? --    No data found.  Updated Vital Signs BP (!) 139/92 (BP Location: Right Arm)   Pulse 78   Temp 98.4 F (36.9 C) (Oral)   Resp 16   LMP 07/17/2021   SpO2 98%   Visual Acuity Right Eye Distance:   Left Eye Distance:   Bilateral Distance:    Right Eye Near:   Left Eye Near:    Bilateral Near:     Physical Exam   UC Treatments / Results  Labs (all labs ordered are listed, but only abnormal results are displayed) Labs Reviewed - No data to display  EKG   Radiology No results found.  Procedures Procedures (including critical care time)  Medications Ordered in UC Medications  methylPREDNISolone sodium succinate (SOLU-MEDROL) 125 mg/2 mL injection 125 mg (has no administration in time range)    Initial Impression / Assessment and Plan / UC Course  I have reviewed the triage vital signs and the nursing notes.  Pertinent labs & imaging results that were available during my  care of the patient were reviewed by me and considered in my medical decision making (see chart for details).     Per my observation, patient does appeared to be clinically depressed.  I am unable to locate any previous diagnoses for this, nor do I see any antidepression medications in her med list.  I have prescribed Lexapro for her because it is easy to begin taking, does not have a long list of side effects and usually has rapid enough onset that patient's will  see benefit sooner than they would with other SSRIs.  Patient has been counseled regarding black box warning for increased suicidal thoughts and ideations when taking SSRIs, particularly in young people, and has been advised to discontinue immediately should she begin to have these thoughts.  I have initiated Singulair to help suppress patient's urticarial episodes, she received an injection of Solu-Medrol today to break the cycle of inflammation.  Patient has been encouraged to follow-up with her primary care provider, I have initiated a request for assistance helping her find 1.  I provided patient with a prescription for nystatin cream to use on the rash beneath her breast which is most likely candidal in etiology.  Patient is to use twice daily until resolved.  I took liberty of renewing her prescription for losartan as I am unable to ascertain whether or not she still has refills remaining.  I have encouraged her to take this regularly to protect her kidneys and prevent further damage from her nephrotic syndrome.  Plan was discussed in detail with patient who verbalized understanding and agreed with plan going forward.  All questions were addressed. Final Clinical Impressions(s) / UC Diagnoses   Final diagnoses:  Urticaria  Dysthymia  Candidal skin infection  Nephrotic syndrome     Discharge Instructions      For your urticaria, please begin Singulair 1 tablet daily at bedtime.  You also received an injection of Solu-Medrol,  a steroid to help calm your skin down.  For your dysthymia, please begin Lexapro.  It will take approximately 2 weeks for this medication to reach full benefit.  Please begin by taking 1/2 tablet daily for 1 week then increase to 1 full tablet.  Please follow-up with primary care to discuss benefits and continuing this medication.  For the rash beneath her breasts please begin nystatin cream, apply twice daily to rash until resolved.  I have renewed your prescription for losartan so that you can continue taking this for your nephrotic syndrome.  Please be sure to keep regular follow-up appointments with your kidney doctor.     ED Prescriptions     Medication Sig Dispense Auth. Provider   losartan (COZAAR) 50 MG tablet Take 1 tablet (50 mg total) by mouth daily. 30 tablet Theadora Rama Scales, PA-C   nystatin cream (MYCOSTATIN) Apply 1 application topically 2 (two) times daily. 60 g Theadora Rama Scales, PA-C   escitalopram (LEXAPRO) 10 MG tablet Take 1 tablet (10 mg total) by mouth daily. 30 tablet Theadora Rama Scales, PA-C   montelukast (SINGULAIR) 10 MG tablet Take 1 tablet (10 mg total) by mouth at bedtime. 30 tablet Theadora Rama Scales, PA-C      PDMP not reviewed this encounter.   Theadora Rama Scales, PA-C 08/05/21 [phone]

## 2021-08-05 NOTE — Discharge Instructions (Addendum)
For your urticaria, please begin Singulair 1 tablet daily at bedtime.  You also received an injection of Solu-Medrol, a steroid to help calm your skin down.  For your dysthymia, please begin Lexapro.  It will take approximately 2 weeks for this medication to reach full benefit.  Please begin by taking 1/2 tablet daily for 1 week then increase to 1 full tablet.  Please follow-up with primary care to discuss benefits and continuing this medication.  For the rash beneath her breasts please begin nystatin cream, apply twice daily to rash until resolved.  I have renewed your prescription for losartan so that you can continue taking this for your nephrotic syndrome.  Please be sure to keep regular follow-up appointments with your kidney doctor.

## 2021-08-08 NOTE — Telephone Encounter (Signed)
Reviewed all questions with patient on phone

## 2021-08-11 ENCOUNTER — Ambulatory Visit: Payer: 59

## 2021-08-11 ENCOUNTER — Encounter: Payer: Self-pay | Admitting: Obstetrics

## 2021-08-11 ENCOUNTER — Other Ambulatory Visit: Payer: Self-pay

## 2021-08-11 ENCOUNTER — Other Ambulatory Visit (HOSPITAL_COMMUNITY)
Admission: RE | Admit: 2021-08-11 | Discharge: 2021-08-11 | Disposition: A | Discharge status: Home / Self Care | Payer: 59 | Source: Ambulatory Visit | Attending: Obstetrics and Gynecology | Admitting: Obstetrics and Gynecology

## 2021-08-11 DIAGNOSIS — Z113 Encounter for screening for infections with a predominantly sexual mode of transmission: Secondary | ICD-10-CM | POA: Diagnosis present

## 2021-08-11 NOTE — Progress Notes (Signed)
SUBJECTIVE:  25 y.o. female wants STD Screening. vaginal discharge : NONE  pt wants to be sure she has not been exposed to an STD from partner.. Denies abnormal vaginal bleeding or significant pelvic pain or fever. No UTI symptoms. Denies history of known exposure to STD.  Patient's last menstrual period was 07/17/2021.  OBJECTIVE:  She appears well, afebrile. Urine dipstick: not done.  ASSESSMENT:  Requesting STD Screening.    PLAN:  GC, chlamydia, trichomonas, BVAG, CVAG probe sent to lab. Treatment: To be determined once lab results are received ROV prn if symptoms persist or worsen.

## 2021-08-13 ENCOUNTER — Other Ambulatory Visit: Payer: Self-pay | Admitting: Obstetrics & Gynecology

## 2021-08-13 DIAGNOSIS — B9689 Other specified bacterial agents as the cause of diseases classified elsewhere: Secondary | ICD-10-CM

## 2021-08-13 LAB — CERVICOVAGINAL ANCILLARY ONLY
Bacterial Vaginitis (gardnerella): POSITIVE — AB
Candida Glabrata: NEGATIVE
Candida Vaginitis: NEGATIVE
Chlamydia: NEGATIVE
Comment: NEGATIVE
Comment: NEGATIVE
Comment: NEGATIVE
Comment: NEGATIVE
Comment: NEGATIVE
Comment: NORMAL
Neisseria Gonorrhea: NEGATIVE
Trichomonas: NEGATIVE

## 2021-08-13 MED ORDER — METRONIDAZOLE 500 MG PO TABS: 500.0000 mg | ORAL_TABLET | Freq: Two times a day (BID) | ORAL | 0 refills | Status: AC

## 2021-10-13 ENCOUNTER — Telehealth: Payer: Self-pay | Admitting: *Deleted

## 2021-10-13 NOTE — Telephone Encounter (Signed)
Pt called to office with questions about Boric Acid for BV. Attempt to return call.  No answer, no VM.

## 2021-10-14 ENCOUNTER — Other Ambulatory Visit: Payer: Self-pay | Admitting: *Deleted

## 2021-10-14 DIAGNOSIS — B9689 Other specified bacterial agents as the cause of diseases classified elsewhere: Secondary | ICD-10-CM

## 2021-10-14 MED ORDER — METRONIDAZOLE 0.75 % VA GEL: 1.0000 | Freq: Every day | VAGINAL | 1 refills | Status: DC

## 2021-10-14 NOTE — Progress Notes (Signed)
Pt called with symptoms of BV, stating bacterial infection. Pt request gel treatment. Metrogel sent today.  Advised of at home changes to help prevent recurrence.  Pt made aware she will need appt if this seems to be a recurrent problem.

## 2022-05-05 ENCOUNTER — Other Ambulatory Visit: Payer: Self-pay

## 2022-05-05 ENCOUNTER — Emergency Department (HOSPITAL_COMMUNITY)
Admission: EM | Admit: 2022-05-05 | Discharge: 2022-05-06 | Disposition: A | Discharge status: Home / Self Care | Payer: 59 | Attending: Emergency Medicine | Admitting: Emergency Medicine

## 2022-05-05 ENCOUNTER — Encounter (HOSPITAL_COMMUNITY): Payer: Self-pay | Admitting: Emergency Medicine

## 2022-05-05 DIAGNOSIS — M545 Low back pain, unspecified: Secondary | ICD-10-CM | POA: Diagnosis present

## 2022-05-05 DIAGNOSIS — R0602 Shortness of breath: Secondary | ICD-10-CM | POA: Diagnosis not present

## 2022-05-05 DIAGNOSIS — R059 Cough, unspecified: Secondary | ICD-10-CM | POA: Insufficient documentation

## 2022-05-05 LAB — URINALYSIS, ROUTINE W REFLEX MICROSCOPIC
Bilirubin Urine: NEGATIVE
Glucose, UA: NEGATIVE mg/dL
Hgb urine dipstick: NEGATIVE
Ketones, ur: NEGATIVE mg/dL
Nitrite: NEGATIVE
Protein, ur: NEGATIVE mg/dL
Specific Gravity, Urine: 1.013 (ref 1.005–1.030)
pH: 6 (ref 5.0–8.0)

## 2022-05-05 LAB — CBC
HCT: 43.3 % (ref 36.0–46.0)
Hemoglobin: 13.8 g/dL (ref 12.0–15.0)
MCH: 27.5 pg (ref 26.0–34.0)
MCHC: 31.9 g/dL (ref 30.0–36.0)
MCV: 86.4 fL (ref 80.0–100.0)
Platelets: 207 10*3/uL (ref 150–400)
RBC: 5.01 MIL/uL (ref 3.87–5.11)
RDW: 15 % (ref 11.5–15.5)
WBC: 6.3 10*3/uL (ref 4.0–10.5)
nRBC: 0 % (ref 0.0–0.2)

## 2022-05-05 LAB — BASIC METABOLIC PANEL
Anion gap: 5 (ref 5–15)
BUN: 14 mg/dL (ref 6–20)
CO2: 26 mmol/L (ref 22–32)
Calcium: 9.1 mg/dL (ref 8.9–10.3)
Chloride: 106 mmol/L (ref 98–111)
Creatinine, Ser: 1.19 mg/dL — ABNORMAL HIGH (ref 0.44–1.00)
GFR, Estimated: >60 mL/min (ref >60)
Glucose, Bld: 87 mg/dL (ref 70–99)
Potassium: 4.2 mmol/L (ref 3.5–5.1)
Sodium: 137 mmol/L (ref 135–145)

## 2022-05-05 NOTE — ED Triage Notes (Signed)
Pt was at home and started coughing. Pt felt a pain in her back and thinks it is her kidneys. Pt states she has nephrotic syndrome. As pt got up, she lost feeling in both legs for about 5 mins. She has regained feeling in her legs.

## 2022-05-05 NOTE — ED Provider Triage Note (Signed)
Emergency Medicine Provider Triage Evaluation Note  Kelsey Ramsey , a 26 y.o. female  was evaluated in triage.  Pt complains of back/flank pain.  History of nephritic and nephrotic syndrome, has not seen a kidney doctor in a while.  States that she was sitting on her cement steps, and had a hard cough.  After that when she tried to stand up she states that she had change in her feeling in her bilateral legs.  She is convinced that her symptoms are related to her kidneys.  Review of Systems  Positive: back\Flank pain Negative: Fever, abdominal pain, urinary sx  Physical Exam  BP (!) 154/89 (BP Location: Right Arm)   Pulse 88   Temp 98.8 F (37.1 C) (Oral)   Resp 16   SpO2 100%  Gen:   Awake, no distress   Resp:  Normal effort  MSK:   Moves extremities without difficulty  Other:    Medical Decision Making  Medically screening exam initiated at 3:45 PM.  Appropriate orders placed.  Kelsey Ramsey was informed that the remainder of the evaluation will be completed by another provider, this initial triage assessment does not replace that evaluation, and the importance of remaining in the ED until their evaluation is complete.     Kelsey Monks, PA-C 05/05/22 1546

## 2022-05-06 ENCOUNTER — Emergency Department (HOSPITAL_COMMUNITY): Payer: 59

## 2022-05-06 LAB — I-STAT BETA HCG BLOOD, ED (MC, WL, AP ONLY): I-stat hCG, quantitative: <5 m[IU]/mL (ref <5)

## 2022-05-06 LAB — HEPATIC FUNCTION PANEL
ALT: 15 U/L (ref 0–44)
AST: 18 U/L (ref 15–41)
Albumin: 3.7 g/dL (ref 3.5–5.0)
Alkaline Phosphatase: 56 U/L (ref 38–126)
Bilirubin, Direct: <0.1 mg/dL (ref 0.0–0.2)
Total Bilirubin: 0.4 mg/dL (ref 0.3–1.2)
Total Protein: 7.2 g/dL (ref 6.5–8.1)

## 2022-05-06 NOTE — ED Provider Notes (Signed)
[facility] Provider [facility]   CSN: [id number] Arrival date & time: 05/05/22  1458     History  Chief Complaint  Patient presents with   Flank Pain    Kelsey MccreedyOyanna N Ramsey is a 26 y.o. female.  The history is provided by the patient and medical records.   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  States she was sitting outside on the concrete steps in front of her house today getting fresh air when she coughed really hard, got a sharp pain in her back and felt like "her legs would not work".  She states she had difficulty standing for a few minutes but then was able to get up and go back into the house.  She did not have any total loss of function of the legs, no focal weakness.  She does not have any bowel or bladder incontinence.  Since then his legs have been working without difficulty, ambulatory without issue.  She reports she is concerned for her kidneys.  She has not seen her nephrologist in quite some time.  She has been drinking water.  She has not noticed any significant swelling of the legs but does report some intermittent shortness of breath.  States her back has been hurting for a few days now.  She denies any injury or fall.  Home Medications Prior to Admission medications   Medication Sig Start Date End Date Taking? Authorizing Provider  escitalopram (LEXAPRO) 10 MG tablet Take 1 tablet (10 mg total) by mouth daily. 08/05/21 09/04/21  Theadora RamaMorgan, Lindsay Scales, PA-C  losartan (COZAAR) 50 MG tablet Take 1 tablet (50 mg total) by mouth daily. 08/05/21 09/04/21  Theadora RamaMorgan, Lindsay Scales, PA-C  metroNIDAZOLE (METROGEL) 0.75 % vaginal gel Place 1 Applicatorful vaginally at bedtime. Apply one applicatorful to vagina at bedtime for 5 days 10/14/21   Adam PhenixArnold, James G, MD  montelukast (SINGULAIR) 10 MG tablet Take 1 tablet (10 mg total) by mouth at bedtime. 08/05/21 09/04/21  Theadora RamaMorgan,  Lindsay Scales, PA-C  medroxyPROGESTERone (DEPO-PROVERA) 150 MG/ML injection Inject 1 mL (150 mg total) into the muscle every 3 (three) months. Patient not taking: Reported on 09/26/2019 12/28/18 12/06/19  Hermina StaggersErvin, Michael L, MD      Allergies    Pineapple    Review of Systems   Review of Systems  Musculoskeletal:  Positive for arthralgias.  All other systems reviewed and are negative.   Physical Exam Updated Vital Signs BP (!) 148/107 (BP Location: Right Arm)   Pulse 64   Temp 98.7 F (37.1 C) (Oral)   Resp 17   SpO2 99%   Physical Exam Vitals and nursing note reviewed.  Constitutional:      Appearance: She is well-developed.  HENT:     Head: Normocephalic and atraumatic.  Eyes:     Conjunctiva/sclera: Conjunctivae normal.     Pupils: Pupils are equal, round, and reactive to light.  Cardiovascular:     Rate and Rhythm: Normal rate and regular rhythm.     Heart sounds: Normal heart sounds.  Pulmonary:     Effort: Pulmonary effort is normal. No respiratory distress.     Breath sounds: Normal breath sounds. No rhonchi.  Abdominal:     General: Bowel sounds are normal.     Palpations: Abdomen is soft.     Tenderness: There is no abdominal tenderness. There is no rebound.     Comments: No CVA tenderness  Musculoskeletal:        General: Normal range of motion.     Cervical back: Normal range of motion.     Comments: No peripheral edema noted of BLE Endorses pain along lumbar spine without noted deformity; full ROM maintained, normal strength/sensation, normal giat  Skin:    General: Skin is warm and dry.  Neurological:     Mental Status: She is alert and oriented to person, place, and time.     ED Results / Procedures / Treatments   Labs (all labs ordered are listed, but only abnormal results are displayed) Labs Reviewed  BASIC METABOLIC PANEL - Abnormal; Notable for the following components:      Result Value   Creatinine, Ser 1.19 (*)    All other components  within normal limits  URINALYSIS, ROUTINE W REFLEX MICROSCOPIC - Abnormal; Notable for the following components:   APPearance HAZY (*)    Leukocytes,Ua TRACE (*)    Bacteria, UA RARE (*)    All other components within normal limits  CBC  HEPATIC FUNCTION PANEL  I-STAT BETA HCG BLOOD, ED ([facility], [facility], [facility] ONLY)    EKG None  Radiology DG Lumbar Spine Complete  Result Date: 05/06/2022 CLINICAL DATA:  26 year old female with shortness of breath, cough, back pain, pleuritic pain. EXAM: LUMBAR SPINE - COMPLETE 4+ VIEW COMPARISON:  Chest radiographs today reported separately. FINDINGS: Normal lumbar segmentation. Bone mineralization is within normal limits. Normal lumbar lordosis; there is subtle retrolisthesis of L5 on S1. Relatively preserved lumbar disc spaces, there is subtle posterior endplate spurring at L1-L2, L4-L5, and L5-S1. No pars fracture. Visible sacrum appears intact. Visible lower thoracic levels appear intact. No acute osseous abnormality identified. Negative visible abdominal and pelvic visceral contours. IMPRESSION: 1. No acute osseous abnormality identified in the lumbar spine. 2. Preserved disc spaces with occasional minor endplate spurring. Electronically Signed   By: Odessa Fleming M.D.   On: 05/06/2022 04:01   DG Chest 2 View  Result Date: 05/06/2022 CLINICAL DATA:  Shortness of breath and cough. EXAM: CHEST - 2 VIEW COMPARISON:  Chest radiograph dated 09/12/2017. FINDINGS: The heart size and mediastinal contours are within normal limits. Both lungs are clear. The visualized skeletal structures are unremarkable. IMPRESSION: No active cardiopulmonary disease. Electronically Signed   By: Elgie Collard M.D.   On: 05/06/2022 03:54    Procedures Procedures    Medications Ordered in ED Medications - No data to display  ED Course/ Medical Decision Making/ A&P                           Medical Decision Making Amount and/or Complexity of Data Reviewed Labs: ordered. Radiology:  ordered and independent interpretation performed. ECG/medicine tests: ordered and independent interpretation performed.  26 year old female presenting to the ED with low back and flank pain.  This occurred after sitting outside on the concrete.  Had difficulty standing up and briefly felt like "legs would not work".  She denies any focal numbness or weakness.  No bowel or bladder incontinence.  Currently back to baseline, ambulatory with steady gait in my presence.  She is concerned this is related to her history of nephrotic/nephritic syndrome.  Admits to some low back pain for a few days now.  She is awake, alert, oriented.  She has no focal neurologic deficits.  She is ambulatory to and from restroom in front of myself and other staff members without any disturbance.  She has no  CVA tenderness on exam.  No peripheral edema.  She does not appear clinically fluid overloaded.  Reviewed her labs today which reveal no profound anemia, no electrolyte derangement.  Serum creatinine is 1.19 which appears around her baseline.  UA without any noted proteinuria.  Hepatic function panel added on with normal protein, no transaminitis.  This is not consistent with state of nephrotic syndrome.  CXR without fluid overload.  Lumbar films without acute findings, does have some endplate spurring.  Remains without focal deficits, remains ambulatory here in the ED.  Not consistent with cauda equina or other emergent spinal process.  Feel she is stable for discharge.  Reports she has FU with her nephrologist soon.  Can return here for any new/acute changes.  Final Clinical Impression(s) / ED Diagnoses Final diagnoses:  Acute bilateral low back pain without sciatica    Rx / DC Orders ED Discharge Orders     None         Garlon Hatchet, PA-C 05/06/22 0445    Zadie Rhine, MD 05/06/22 [phone]

## 2022-05-06 NOTE — Discharge Instructions (Signed)
Your kidney numbers today are normal. Protein levels are normal, urine is normal. Please follow-up with your nephrologist as scheduled. Return here for new concerns.

## 2022-07-07 ENCOUNTER — Encounter (HOSPITAL_COMMUNITY): Payer: Self-pay

## 2022-07-07 ENCOUNTER — Ambulatory Visit (HOSPITAL_COMMUNITY)
Admission: RE | Admit: 2022-07-07 | Discharge: 2022-07-07 | Disposition: A | Discharge status: Home / Self Care | Payer: Commercial Managed Care - HMO | Source: Ambulatory Visit | Attending: Family Medicine | Admitting: Family Medicine

## 2022-07-07 VITALS — BP 134/66 | HR 75 | Temp 98.8°F | Resp 16

## 2022-07-07 DIAGNOSIS — Z3202 Encounter for pregnancy test, result negative: Secondary | ICD-10-CM | POA: Diagnosis not present

## 2022-07-07 DIAGNOSIS — J029 Acute pharyngitis, unspecified: Secondary | ICD-10-CM | POA: Insufficient documentation

## 2022-07-07 DIAGNOSIS — F17290 Nicotine dependence, other tobacco product, uncomplicated: Secondary | ICD-10-CM | POA: Insufficient documentation

## 2022-07-07 DIAGNOSIS — Z113 Encounter for screening for infections with a predominantly sexual mode of transmission: Secondary | ICD-10-CM | POA: Insufficient documentation

## 2022-07-07 LAB — POC URINE PREG, ED: Preg Test, Ur: NEGATIVE

## 2022-07-07 LAB — POCT RAPID STREP A, ED / UC: Streptococcus, Group A Screen (Direct): NEGATIVE

## 2022-07-07 LAB — POCT INFECTIOUS MONO SCREEN, ED / UC: Mono Screen: NEGATIVE

## 2022-07-07 NOTE — ED Provider Notes (Signed)
[facility]    CSN: [id number] Arrival date & time: 07/07/22  1751      History   Chief Complaint Chief Complaint  Patient presents with   Oral Swelling    HPI Kelsey Ramsey is a 26 y.o. female.   HPI Here for sore throat that began at least this morning.  She has had a little bit a couple of days before this.  But it would wax and wane.  No fever or chills.  No cough or congestion.No vomiting  She expresses concern for STD.  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      Home Medications    Prior to Admission medications   Medication Sig Start Date End Date Taking? Authorizing Provider  albuterol (VENTOLIN HFA) 108 (90 Base) MCG/ACT inhaler Inhale 2 puffs into the lungs every 4 (four) hours as needed. 03/31/22   [provider]  losartan (COZAAR) 50 MG tablet Take 1 tablet (50 mg total) by mouth daily. 08/05/21 05/06/22  Theadora Rama Scales, PA-C  metroNIDAZOLE (METROGEL) 0.75 % vaginal gel Place 1 Applicatorful vaginally at bedtime. Apply one applicatorful to vagina at bedtime for 5 days Patient not taking: Reported on 05/06/2022 10/14/21   Adam Phenix, MD  montelukast (SINGULAIR) 10 MG tablet Take 1 tablet (10 mg total) by mouth at bedtime. Patient not taking: Reported on 05/06/2022 08/05/21 05/06/22  Theadora Rama Scales, PA-C  medroxyPROGESTERone (DEPO-PROVERA) 150 MG/ML injection Inject 1 mL (150 mg total) into the muscle every 3 (three) months. Patient not taking: Reported on 09/26/2019 12/28/18 12/06/19  Hermina Staggers, MD    Family History Family History  Problem Relation Age of Onset   Diabetes Paternal Grandfather    Stroke Maternal Grandmother    Cancer Maternal Grandfather    Diabetes Maternal Grandfather    Hypertension Mother    Heart disease Mother    Thyroid disease Mother    Kidney Stones Maternal Uncle     Social History Social History   Tobacco Use   Smoking status: Some Days    Types: Cigars   Smokeless tobacco: Never   Tobacco comments:    rare  Vaping Use   Vaping Use: Never used  Substance Use Topics   Alcohol use: Yes    Comment: socially   Drug use: No     Allergies   Pineapple   Review of Systems Review of Systems   Physical Exam Triage  Vital Signs ED Triage Vitals  Enc Vitals Group     BP 07/07/22 1811 134/66     Pulse Rate 07/07/22 1810 75     Resp 07/07/22 1810 16     Temp 07/07/22 1810 98.8 F (37.1 C)     Temp Source 07/07/22 1810 Oral     SpO2 07/07/22 1810 97 %     Weight --      Height --      Head Circumference --      Peak Flow --      Pain Score 07/07/22 1809 2     Pain Loc --      Pain Edu? --      Excl. in [place]? --    No data found.  Updated Vital Signs BP 134/66 (BP Location: Left Arm)   Pulse 75   Temp 98.8 F (37.1 C) (Oral)   Resp 16   LMP 06/21/2022 (Exact Date)   SpO2 97%   Visual Acuity Right Eye Distance:   Left  Eye Distance:   Bilateral Distance:    Right Eye Near:   Left Eye Near:    Bilateral Near:     Physical Exam Vitals reviewed.  Constitutional:      General: She is not in acute distress.    Appearance: She is not toxic-appearing or diaphoretic.  HENT:     Right Ear: Tympanic membrane normal.     Left Ear: Tympanic membrane normal.     Nose: Nose normal.     Mouth/Throat:     Mouth: Mucous membranes are moist.     Comments: There is mild erythema of the tonsils and they are hypertrophied 2+.  Clear mucus is in the tonsillar crypts.  There is no asymmetry Eyes:     Extraocular Movements: Extraocular movements intact.     Conjunctiva/sclera: Conjunctivae normal.     Pupils: Pupils are equal, round, and reactive to light.  Cardiovascular:     Rate and Rhythm: Normal rate and regular rhythm.     Heart sounds: No murmur heard. Pulmonary:     Effort: Pulmonary effort is normal.     Breath sounds: Normal breath sounds. No stridor. No wheezing, rhonchi or rales.  Musculoskeletal:     Cervical back: Neck supple.  Lymphadenopathy:     Cervical: No cervical adenopathy.  Skin:    Coloration: Skin is not jaundiced or pale.  Neurological:     Mental Status: She is alert and oriented to person, place, and time.  Psychiatric:        Behavior: Behavior normal.      UC Treatments / Results  Labs (all labs ordered are listed, but only abnormal results are displayed) Labs Reviewed  CULTURE, GROUP A STREP [facility])  POCT RAPID STREP A, ED / UC  POCT INFECTIOUS MONO SCREEN, ED / UC  POC URINE PREG, ED  CYTOLOGY, (ORAL, ANAL, URETHRAL) ANCILLARY ONLY  CERVICOVAGINAL ANCILLARY ONLY    EKG   Radiology No results found.  Procedures Procedures (including critical care time)  Medications Ordered in UC Medications - No data to display  Initial Impression / Assessment and Plan / UC Course  I have reviewed the triage vital signs and the nursing notes.  Pertinent labs & imaging  results that were available during my care of the patient were reviewed by me and considered in my medical decision making (see chart for details).     Strep is negative, so throat culture is sent.  We will notify her and treat per protocol if positive. Monospot is also negative.  To address her concerns otology swab is done for gonorrhea and chlamydia of her oropharynx.  Also vaginal swab was done.  Staff will notify her of any positives  UPT is negative  Gust with her that most likely this is a different viral pharyngitis, but she is very concerned that this might be an STD.  Staff will notify her of any positives  Final Clinical Impressions(s) / UC Diagnoses   Final diagnoses:  Acute pharyngitis, unspecified etiology  Screen for STD (sexually transmitted disease)     Discharge Instructions      Your strep test is negative.  Culture of the throat will be sent, and staff will notify you if that is in turn positive.  The Monospot was also negative  Staff will notify you if either of the swabs has any positives on it  Your pregnancy test was negative     ED Prescriptions   None    PDMP not reviewed this encounter.   Zenia Resides, MD 07/07/22 1921

## 2022-07-07 NOTE — ED Triage Notes (Signed)
Pt states swelling to the back of her throat and tongue  for the past 2 days. States she thinks she might have an STD.

## 2022-07-07 NOTE — Discharge Instructions (Addendum)
Your strep test is negative.  Culture of the throat will be sent, and staff will notify you if that is in turn positive.  The Monospot was also negative  Staff will notify you if either of the swabs has any positives on it  Your pregnancy test was negative

## 2022-07-08 ENCOUNTER — Telehealth (HOSPITAL_COMMUNITY): Payer: Self-pay | Admitting: Emergency Medicine

## 2022-07-08 LAB — CERVICOVAGINAL ANCILLARY ONLY
Bacterial Vaginitis (gardnerella): POSITIVE — AB
Candida Glabrata: NEGATIVE
Candida Vaginitis: NEGATIVE
Chlamydia: POSITIVE — AB
Comment: NEGATIVE
Comment: NEGATIVE
Comment: NEGATIVE
Comment: NEGATIVE
Comment: NEGATIVE
Comment: NORMAL
Neisseria Gonorrhea: NEGATIVE
Trichomonas: POSITIVE — AB

## 2022-07-08 LAB — CYTOLOGY, (ORAL, ANAL, URETHRAL) ANCILLARY ONLY
Chlamydia: NEGATIVE
Comment: NEGATIVE
Comment: NEGATIVE
Comment: NORMAL
Neisseria Gonorrhea: NEGATIVE
Trichomonas: NEGATIVE

## 2022-07-08 MED ORDER — DOXYCYCLINE HYCLATE 100 MG PO CAPS: 100.0000 mg | ORAL_CAPSULE | Freq: Two times a day (BID) | ORAL | 0 refills | Status: AC

## 2022-07-08 MED ORDER — METRONIDAZOLE 500 MG PO TABS: 500.0000 mg | ORAL_TABLET | Freq: Two times a day (BID) | ORAL | 0 refills | Status: DC

## 2022-07-10 LAB — CULTURE, GROUP A STREP (THRC)

## 2022-08-11 ENCOUNTER — Other Ambulatory Visit (HOSPITAL_COMMUNITY)
Admission: RE | Admit: 2022-08-11 | Discharge: 2022-08-11 | Disposition: A | Discharge status: Home / Self Care | Payer: Commercial Managed Care - HMO | Source: Ambulatory Visit | Attending: Family Medicine | Admitting: Family Medicine

## 2022-08-11 ENCOUNTER — Ambulatory Visit (INDEPENDENT_AMBULATORY_CARE_PROVIDER_SITE_OTHER): Payer: Commercial Managed Care - HMO | Admitting: General Practice

## 2022-08-11 VITALS — BP 138/90 | HR 67 | Ht 67.0 in | Wt 216.1 lb

## 2022-08-11 DIAGNOSIS — N898 Other specified noninflammatory disorders of vagina: Secondary | ICD-10-CM | POA: Insufficient documentation

## 2022-08-11 LAB — POCT URINE PREGNANCY: Preg Test, Ur: NEGATIVE

## 2022-08-11 NOTE — Progress Notes (Signed)
SUBJECTIVE:  26 y.o. female complains of vaginal irritation. Denies abnormal vaginal bleeding or significant pelvic pain or fever. No UTI symptoms. Denies history of known exposure to STD. Pt tested positive for Chlamydia, Trichomonas and BV 07-07-22.  No LMP recorded.  OBJECTIVE:  She appears well, afebrile. Urine dipstick: negative for all components.  ASSESSMENT:  Vaginal Discharge  Vaginal Odor   PLAN:  GC, chlamydia, trichomonas, BVAG, CVAG probe sent to lab. Treatment: To be determined once lab results are received ROV prn if symptoms persist or worsen.

## 2022-08-12 LAB — CERVICOVAGINAL ANCILLARY ONLY
Bacterial Vaginitis (gardnerella): POSITIVE — AB
Candida Glabrata: NEGATIVE
Candida Vaginitis: POSITIVE — AB
Chlamydia: NEGATIVE
Comment: NEGATIVE
Comment: NEGATIVE
Comment: NEGATIVE
Comment: NEGATIVE
Comment: NEGATIVE
Comment: NORMAL
Neisseria Gonorrhea: NEGATIVE
Trichomonas: NEGATIVE

## 2022-08-12 LAB — RPR: RPR Ser Ql: NONREACTIVE

## 2022-08-12 LAB — HEPATITIS B SURFACE ANTIGEN: Hepatitis B Surface Ag: NEGATIVE

## 2022-08-12 LAB — HIV ANTIBODY (ROUTINE TESTING W REFLEX): HIV Screen 4th Generation wRfx: NONREACTIVE

## 2022-08-12 LAB — HEPATITIS C ANTIBODY: Hep C Virus Ab: NONREACTIVE

## 2022-08-14 LAB — URINE CULTURE

## 2022-08-16 ENCOUNTER — Other Ambulatory Visit: Payer: Self-pay | Admitting: Family Medicine

## 2022-08-16 DIAGNOSIS — B9689 Other specified bacterial agents as the cause of diseases classified elsewhere: Secondary | ICD-10-CM

## 2022-08-16 MED ORDER — METRONIDAZOLE 0.75 % VA GEL: 1.0000 | Freq: Every day | VAGINAL | 1 refills | Status: DC

## 2022-08-16 MED ORDER — FLUCONAZOLE 150 MG PO TABS: 150.0000 mg | ORAL_TABLET | Freq: Every day | ORAL | 2 refills | Status: DC

## 2022-10-23 ENCOUNTER — Encounter: Payer: Self-pay | Admitting: Emergency Medicine

## 2022-10-23 ENCOUNTER — Ambulatory Visit
Admission: EM | Admit: 2022-10-23 | Discharge: 2022-10-23 | Disposition: A | Discharge status: Home / Self Care | Payer: Commercial Managed Care - HMO | Attending: Internal Medicine | Admitting: Internal Medicine

## 2022-10-23 DIAGNOSIS — Z20818 Contact with and (suspected) exposure to other bacterial communicable diseases: Secondary | ICD-10-CM | POA: Diagnosis not present

## 2022-10-23 DIAGNOSIS — J039 Acute tonsillitis, unspecified: Secondary | ICD-10-CM

## 2022-10-23 DIAGNOSIS — Z113 Encounter for screening for infections with a predominantly sexual mode of transmission: Secondary | ICD-10-CM | POA: Insufficient documentation

## 2022-10-23 LAB — POCT RAPID STREP A (OFFICE): Rapid Strep A Screen: NEGATIVE

## 2022-10-23 MED ORDER — AMOXICILLIN-POT CLAVULANATE 875-125 MG PO TABS: 1.0000 | ORAL_TABLET | Freq: Two times a day (BID) | ORAL | 0 refills | Status: DC

## 2022-10-23 NOTE — Discharge Instructions (Signed)
Rapid strep test was negative but I am still suspicious of strep throat given the appearance of your throat on exam.  Therefore, I am treating with an antibiotic that has been sent to the pharmacy.  Your STD tests are pending.  We will call if they are positive.  We ask that you refrain from sexual activity until test results and treatment are complete.

## 2022-10-23 NOTE — ED Provider Notes (Signed)
[name]    CSN: [id number] Arrival date & time: 10/23/22  1219      History   Chief Complaint Chief Complaint  Patient presents with   SEXUALLY TRANSMITTED DISEASE   trouble swallowing     HPI Kelsey Ramsey is a 26 y.o. female.   Patient presents with dry and scratchy throat with associated painful swallowing that started about 3 days ago.  She reports that she has been exposed to someone who has had similar symptoms and was exposed to strep throat.  She denies nasal congestion, runny nose, cough but reports that she has "not been feeling well".  Denies any associated fever.  Denies chest pain, shortness of breath, nausea, vomiting, diarrhea, abdominal pain.  Patient also requesting routine STD testing.  She denies any recent exposure.  She reports new sexual partner approximately 1 month ago.  Denies any associated symptoms.  Patient would like full STD testing including blood work for HIV and syphilis.  Last menstrual cycle was 10/10/2022.     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      Home Medications    Prior to Admission medications   Medication Sig Start Date End Date Taking? Authorizing Provider  amoxicillin-clavulanate (AUGMENTIN) 875-125 MG tablet Take 1 tablet by mouth every 12 (twelve) hours. 10/23/22  Yes Delesha Pohlman, Acie Fredrickson, FNP  albuterol (VENTOLIN HFA) 108 (90 Base) MCG/ACT inhaler Inhale 2 puffs into the lungs every 4 (four) hours as needed. 03/31/22   [provider]  fluconazole (DIFLUCAN) 150 MG tablet Take 1 tablet (150 mg total) by mouth daily. Repeat in 24 hours if needed 08/16/22   Reva Bores, MD  losartan (COZAAR) 50 MG tablet Take 1 tablet (50 mg total) by mouth daily. 08/05/21 08/11/22  Theadora Rama Scales, PA-C  metroNIDAZOLE (FLAGYL) 500 MG tablet Take 1 tablet (500 mg total) by mouth 2 (two) times daily. Patient not taking: Reported on 08/11/2022 07/08/22   Merrilee Jansky, MD  metroNIDAZOLE (METROGEL) 0.75 % vaginal gel Place 1 Applicatorful vaginally at bedtime. Apply one applicatorful to vagina at bedtime for 5 days 08/16/22   Reva Bores, MD  montelukast (SINGULAIR) 10 MG tablet Take 1 tablet (10 mg total) by mouth at bedtime. 08/05/21 08/11/22  Theadora Rama Scales, PA-C  medroxyPROGESTERone (DEPO-PROVERA) 150 MG/ML injection Inject 1 mL (150 mg total) into the muscle every 3 (three) months. Patient not taking: Reported on 09/26/2019 12/28/18 12/06/19  Hermina Staggers, MD    Family History Family History  Problem  Relation Age of Onset   Diabetes Paternal Grandfather    Stroke Maternal Grandmother    Cancer Maternal Grandfather    Diabetes Maternal Grandfather    Hypertension Mother    Heart disease Mother    Thyroid disease Mother    Kidney Stones Maternal Uncle     Social History Social History   Tobacco Use   Smoking status: Some Days    Types: Cigars   Smokeless tobacco: Never   Tobacco comments:    rare  Vaping Use    Vaping Use: Never used  Substance Use Topics   Alcohol use: Yes    Comment: socially   Drug use: No     Allergies   Pineapple   Review of Systems Review of Systems Per HPI  Physical Exam Triage Vital Signs ED Triage Vitals [10/23/22 1242]  Enc Vitals Group     BP (!) 166/87     Pulse Rate (!) 102     Resp 18     Temp 98.1 F (36.7 C)     Temp src      SpO2 98 %     Weight      Height      Head Circumference      Peak Flow      Pain Score      Pain Loc      Pain Edu?      Excl. in [place]?    No data found.  Updated Vital Signs BP 126/78   Pulse 86   Temp 98.1 F (36.7 C)   Resp 18   SpO2 98%   Visual Acuity Right Eye Distance:   Left Eye Distance:   Bilateral Distance:    Right Eye Near:   Left Eye Near:    Bilateral Near:     Physical Exam Constitutional:      General: She is not in acute distress.    Appearance: Normal appearance. She is not toxic-appearing or diaphoretic.  HENT:     Head: Normocephalic and atraumatic.     Right Ear: Tympanic membrane and ear canal normal.     Left Ear: Tympanic membrane and ear canal normal.     Nose: Congestion present.     Mouth/Throat:     Mouth: Mucous membranes are moist.     Pharynx: Posterior oropharyngeal erythema present. No pharyngeal swelling, oropharyngeal exudate or uvula swelling.     Tonsils: Tonsillar exudate present. No tonsillar abscesses. 1+ on the right. 1+ on the left.  Eyes:     Extraocular Movements: Extraocular movements intact.     Conjunctiva/sclera: Conjunctivae normal.     Pupils: Pupils are equal, round, and reactive to light.  Cardiovascular:     Rate and Rhythm: Normal rate and regular rhythm.     Pulses: Normal pulses.     Heart sounds: Normal heart sounds.  Pulmonary:     Effort: Pulmonary effort is normal. No respiratory distress.     Breath sounds: Normal breath sounds. No stridor. No wheezing, rhonchi or rales.  Abdominal:     General: Abdomen is flat. Bowel sounds  are normal.     Palpations: Abdomen is soft.  Genitourinary:    Comments: Deferred with shared decision making.  Self swab performed. Musculoskeletal:        General: Normal range of motion.     Cervical back: Normal range of motion.  Skin:    General: Skin is warm and dry.  Neurological:  General: No focal deficit present.     Mental Status: She is alert and oriented to person, place, and time. Mental status is at baseline.  Psychiatric:        Mood and Affect: Mood normal.        Behavior: Behavior normal.      UC Treatments / Results  Labs (all labs ordered are listed, but only abnormal results are displayed) Labs Reviewed  CULTURE, GROUP A STREP ([place])  RPR  HIV ANTIBODY (ROUTINE TESTING W REFLEX)  POCT RAPID STREP A (OFFICE)  CYTOLOGY, (ORAL, ANAL, URETHRAL) ANCILLARY ONLY  CERVICOVAGINAL ANCILLARY ONLY    EKG   Radiology No results found.  Procedures Procedures (including critical care time)  Medications Ordered in UC Medications - No data to display  Initial Impression / Assessment and Plan / UC Course  I have reviewed the triage vital signs and the nursing notes.  Pertinent labs & imaging results that were available during my care of the patient were reviewed by me and considered in my medical decision making (see chart for details).     Rapid strep was negative but I am still suspicious of strep throat given appearance of posterior pharynx and acute tonsillitis on exam.  Will opt to treat with Augmentin antibiotic.  Patient has nephrotic syndrome but last creatinine clearance is normal so no dosage adjustment necessary.  Throat culture is pending.  Discussed with patient that viral illness could also be causing symptoms and discuss supportive care with patient.  No signs of peritonsillar abscess on exam.  Routine STD testing completed including cervicovaginal swab, HIV, RPR.  Patient requesting cytology swab of throat given that she performed oral sex  with new sexual partner.  Cytology swab pending.  Will await results for any further treatment.  Patient advised to refrain from sexual activity until test results and treatment are complete.  Discussed return precautions.  Patient verbalized understanding and was agreeable with plan. Final Clinical Impressions(s) / UC Diagnoses   Final diagnoses:  Screening examination for venereal disease  Acute tonsillitis, unspecified etiology  Streptococcus exposure     Discharge Instructions      Rapid strep test was negative but I am still suspicious of strep throat given the appearance of your throat on exam.  Therefore, I am treating with an antibiotic that has been sent to the pharmacy.  Your STD tests are pending.  We will call if they are positive.  We ask that you refrain from sexual activity until test results and treatment are complete.    ED Prescriptions     Medication Sig Dispense Auth. Provider   amoxicillin-clavulanate (AUGMENTIN) 875-125 MG tablet Take 1 tablet by mouth every 12 (twelve) hours. 14 tablet [place], Michele Rockers, [place]      PDMP not reviewed this encounter.   Teodora Medici, [place] 10/23/22 1325

## 2022-10-23 NOTE — ED Triage Notes (Signed)
Pt is present today with with concerns of difficulty swallowing while trying to eat. Pt states that she noticed it x3 days ago.   Pt states that she would also like to be tested for STD. Pt denies any sx

## 2022-10-24 ENCOUNTER — Telehealth: Payer: Self-pay | Admitting: Emergency Medicine

## 2022-10-24 LAB — RPR: RPR Ser Ql: NONREACTIVE

## 2022-10-24 LAB — HIV ANTIBODY (ROUTINE TESTING W REFLEX): HIV Screen 4th Generation wRfx: NONREACTIVE

## 2022-10-24 MED ORDER — AMOXICILLIN-POT CLAVULANATE 875-125 MG PO TABS
1.0000 | ORAL_TABLET | Freq: Two times a day (BID) | ORAL | 0 refills | Status: DC
Start: 2022-10-24 — End: 2022-12-19

## 2022-10-26 LAB — CERVICOVAGINAL ANCILLARY ONLY
Bacterial Vaginitis (gardnerella): POSITIVE — AB
Candida Glabrata: NEGATIVE
Candida Vaginitis: NEGATIVE
Chlamydia: NEGATIVE
Comment: NEGATIVE
Comment: NEGATIVE
Comment: NEGATIVE
Comment: NEGATIVE
Comment: NEGATIVE
Comment: NORMAL
Neisseria Gonorrhea: NEGATIVE
Trichomonas: NEGATIVE

## 2022-10-26 LAB — CYTOLOGY, (ORAL, ANAL, URETHRAL) ANCILLARY ONLY
Chlamydia: NEGATIVE
Comment: NEGATIVE
Comment: NEGATIVE
Comment: NORMAL
Neisseria Gonorrhea: NEGATIVE
Trichomonas: NEGATIVE

## 2022-10-26 LAB — CULTURE, GROUP A STREP (THRC)

## 2022-10-27 ENCOUNTER — Telehealth (HOSPITAL_COMMUNITY): Payer: Self-pay | Admitting: Emergency Medicine

## 2022-10-27 ENCOUNTER — Ambulatory Visit (HOSPITAL_COMMUNITY): Payer: Self-pay

## 2022-10-27 MED ORDER — METRONIDAZOLE 0.75 % VA GEL: 1.0000 | Freq: Every day | VAGINAL | 0 refills | Status: AC

## 2022-10-31 ENCOUNTER — Emergency Department (HOSPITAL_COMMUNITY): Payer: Commercial Managed Care - HMO

## 2022-10-31 ENCOUNTER — Encounter (HOSPITAL_COMMUNITY): Payer: Self-pay

## 2022-10-31 ENCOUNTER — Emergency Department (HOSPITAL_COMMUNITY)
Admission: EM | Admit: 2022-10-31 | Discharge: 2022-10-31 | Disposition: A | Discharge status: Home / Self Care | Payer: Commercial Managed Care - HMO | Attending: Emergency Medicine | Admitting: Emergency Medicine

## 2022-10-31 DIAGNOSIS — Z79899 Other long term (current) drug therapy: Secondary | ICD-10-CM | POA: Diagnosis not present

## 2022-10-31 DIAGNOSIS — X58XXXA Exposure to other specified factors, initial encounter: Secondary | ICD-10-CM | POA: Diagnosis not present

## 2022-10-31 DIAGNOSIS — S39012A Strain of muscle, fascia and tendon of lower back, initial encounter: Secondary | ICD-10-CM

## 2022-10-31 DIAGNOSIS — M545 Low back pain, unspecified: Secondary | ICD-10-CM | POA: Diagnosis present

## 2022-10-31 LAB — CBC WITH DIFFERENTIAL/PLATELET
Abs Immature Granulocytes: 0.01 10*3/uL (ref 0.00–0.07)
Basophils Absolute: 0 10*3/uL (ref 0.0–0.1)
Basophils Relative: 0 %
Eosinophils Absolute: 0 10*3/uL (ref 0.0–0.5)
Eosinophils Relative: 1 %
HCT: 42.2 % (ref 36.0–46.0)
Hemoglobin: 13.8 g/dL (ref 12.0–15.0)
Immature Granulocytes: 0 %
Lymphocytes Relative: 32 %
Lymphs Abs: 1.8 10*3/uL (ref 0.7–4.0)
MCH: 28.5 pg (ref 26.0–34.0)
MCHC: 32.7 g/dL (ref 30.0–36.0)
MCV: 87 fL (ref 80.0–100.0)
Monocytes Absolute: 0.7 10*3/uL (ref 0.1–1.0)
Monocytes Relative: 12 %
Neutro Abs: 3 10*3/uL (ref 1.7–7.7)
Neutrophils Relative %: 55 %
Platelets: 189 10*3/uL (ref 150–400)
RBC: 4.85 MIL/uL (ref 3.87–5.11)
RDW: 14.5 % (ref 11.5–15.5)
WBC: 5.5 10*3/uL (ref 4.0–10.5)
nRBC: 0 % (ref 0.0–0.2)

## 2022-10-31 LAB — URINALYSIS, ROUTINE W REFLEX MICROSCOPIC
Bilirubin Urine: NEGATIVE
Glucose, UA: NEGATIVE mg/dL
Hgb urine dipstick: NEGATIVE
Ketones, ur: NEGATIVE mg/dL
Leukocytes,Ua: NEGATIVE
Nitrite: NEGATIVE
Protein, ur: NEGATIVE mg/dL
Specific Gravity, Urine: 1.011 (ref 1.005–1.030)
pH: 7 (ref 5.0–8.0)

## 2022-10-31 LAB — COMPREHENSIVE METABOLIC PANEL
ALT: 21 U/L (ref 0–44)
AST: 22 U/L (ref 15–41)
Albumin: 3.6 g/dL (ref 3.5–5.0)
Alkaline Phosphatase: 52 U/L (ref 38–126)
Anion gap: 7 (ref 5–15)
BUN: 12 mg/dL (ref 6–20)
CO2: 27 mmol/L (ref 22–32)
Calcium: 9.5 mg/dL (ref 8.9–10.3)
Chloride: 104 mmol/L (ref 98–111)
Creatinine, Ser: 1.01 mg/dL — ABNORMAL HIGH (ref 0.44–1.00)
GFR, Estimated: >60 mL/min (ref >60)
Glucose, Bld: 103 mg/dL — ABNORMAL HIGH (ref 70–99)
Potassium: 3.9 mmol/L (ref 3.5–5.1)
Sodium: 138 mmol/L (ref 135–145)
Total Bilirubin: 0.8 mg/dL (ref 0.3–1.2)
Total Protein: 7.7 g/dL (ref 6.5–8.1)

## 2022-10-31 LAB — I-STAT BETA HCG BLOOD, ED (MC, WL, AP ONLY): I-stat hCG, quantitative: <5 m[IU]/mL (ref <5)

## 2022-10-31 MED ORDER — CYCLOBENZAPRINE HCL 10 MG PO TABS: ORAL_TABLET | ORAL | 0 refills | Status: DC

## 2022-10-31 MED ORDER — IBUPROFEN 800 MG PO TABS: 800.0000 mg | ORAL_TABLET | Freq: Three times a day (TID) | ORAL | 0 refills | Status: DC | PRN

## 2022-10-31 NOTE — ED Provider Notes (Signed)
[facility]-EMERGENCY DEPT Provider Note   CSN: [id number] Arrival date & time: 10/31/22  5638     History {Add pertinent medical, surgical, social history, OB history to HPI:1} Chief Complaint  Patient presents with   Flank Pain    Kelsey Ramsey is a 26 y.o. female.  Patient complains of lower back pain.  Patient also states that she is having pain rating down her right leg.   Flank Pain       Home Medications Prior to Admission medications   Medication Sig Start Date End Date Taking? Authorizing Provider  cyclobenzaprine (FLEXERIL) 10 MG tablet Take one every 12 hours for pain not relieve 10/31/22  Yes Bethann Berkshire, MD  ibuprofen (ADVIL) 800 MG tablet Take 1 tablet (800 mg total) by mouth every 8 (eight) hours as needed for moderate pain. 10/31/22  Yes Bethann Berkshire, MD  albuterol (VENTOLIN HFA) 108 (90 Base) MCG/ACT inhaler Inhale 2 puffs into the lungs every 4 (four) hours as needed. 03/31/22   [provider]  amoxicillin-clavulanate (AUGMENTIN) 875-125 MG tablet Take 1 tablet by mouth every 12 (twelve) hours. 10/24/22   Gustavus Bryant, FNP  fluconazole (DIFLUCAN) 150 MG tablet Take 1 tablet (150 mg total) by mouth daily. Repeat in 24 hours if needed 08/16/22   Reva Bores, MD  losartan (COZAAR) 50 MG tablet Take 1 tablet (50 mg total) by mouth daily. 08/05/21 08/11/22  Theadora Rama Scales, PA-C  metroNIDAZOLE (FLAGYL) 500 MG tablet Take 1 tablet (500 mg total) by mouth 2 (two) times daily. Patient not taking: Reported on 08/11/2022 07/08/22   Merrilee Jansky, MD  metroNIDAZOLE (METROGEL) 0.75 % vaginal gel Place 1 Applicatorful vaginally at bedtime for 5 days. 10/27/22 11/01/22  Merrilee Jansky, MD  montelukast (SINGULAIR) 10 MG tablet Take 1 tablet (10 mg total) by mouth at bedtime. 08/05/21 08/11/22  Theadora Rama Scales, PA-C  medroxyPROGESTERone (DEPO-PROVERA) 150 MG/ML injection Inject 1 mL (150 mg total) into the muscle every 3  (three) months. Patient not taking: Reported on 09/26/2019 12/28/18 12/06/19  Hermina Staggers, MD      Allergies    Pineapple    Review of Systems   Review of Systems  Genitourinary:  Positive for flank pain.    Physical Exam Updated Vital Signs BP (!) 143/87 (BP Location: Right Arm)   Pulse 74   Temp 98.6 F (37 C) (Oral)   Resp 18   LMP 10/09/2022 (Approximate)   SpO2 100%  Physical Exam  ED Results / Procedures / Treatments   Labs (all labs ordered are listed, but only abnormal results are displayed) Labs Reviewed  COMPREHENSIVE METABOLIC PANEL - Abnormal; Notable for the following components:      Result Value   Glucose, Bld 103 (*)    Creatinine, Ser 1.01 (*)    All other components within normal limits  URINALYSIS, ROUTINE W REFLEX MICROSCOPIC  CBC WITH DIFFERENTIAL/PLATELET  I-STAT BETA HCG BLOOD, ED ([facility], [facility], [facility] ONLY)    EKG None  Radiology No results found.  Procedures Procedures  {Document cardiac monitor, telemetry assessment procedure when appropriate:1}  Medications Ordered in ED Medications - No data to display  ED Course/ Medical Decision Making/ A&P                           Medical Decision Making Amount and/or Complexity of Data Reviewed Labs: ordered.  Risk Prescription drug management.   Patient  with lumbar strain.  She is started on Flexeril Motrin  {Document critical care time when appropriate:1} {Document review of labs and clinical decision tools ie heart score, Chads2Vasc2 etc:1}  {Document your independent review of radiology images, and any outside records:1} {Document your discussion with family members, caretakers, and with consultants:1} {Document social determinants of health affecting pt's care:1} {Document your decision making why or why not admission, treatments were needed:1} Final Clinical Impression(s) / ED Diagnoses Final diagnoses:  Lumbar strain, initial encounter    Rx / DC Orders ED Discharge Orders           Ordered    ibuprofen (ADVIL) 800 MG tablet  Every 8 hours PRN        10/31/22 1419    cyclobenzaprine (FLEXERIL) 10 MG tablet        10/31/22 1419

## 2022-10-31 NOTE — ED Provider Triage Note (Signed)
Emergency Medicine Provider Triage Evaluation Note  Kelsey Ramsey , a 25 y.o. female  was evaluated in triage.  Pt complains of bilateral flank pain that is been ongoing for the last couple days.  Does have a prior history of nephritic syndrome, reports that she was placed on amoxicillin approximately 3 days ago and has been taking this for a possible tonsillitis.  She is endorsing some urinary symptoms along with some lower back pain as well.  Review of Systems  Positive: Urinary symptoms, back pain Negative: Fever, nausea, vomiting  Physical Exam  BP (!) 152/83 (BP Location: Left Arm)   Pulse 74   Temp 98.9 F (37.2 C) (Oral)   Resp 18   LMP 10/09/2022 (Approximate)   SpO2 99%  Gen:   Awake, no distress   Resp:  Normal effort  MSK:   Moves extremities without difficulty  Other:  LEFT CVA tenderness  Medical Decision Making  Medically screening exam initiated at 9:45 AM.  Appropriate orders placed.  SHILYNN HOCH was informed that the remainder of the evaluation will be completed by another provider, this initial triage assessment does not replace that evaluation, and the importance of remaining in the ED until their evaluation is complete.     Claude Manges, PA-C 10/31/22 [phone]

## 2022-10-31 NOTE — ED Triage Notes (Signed)
Pt presents with c/o bilateral flank pain for approx 4 days. Pt reports that it began after she was stopped taking antibiotics that she was taking for her tonsils. Pt reports hx of issues with her kidneys in the past.

## 2022-10-31 NOTE — Discharge Instructions (Signed)
Follow-up with your family doctor next week if not improving °

## 2022-11-21 ENCOUNTER — Ambulatory Visit (HOSPITAL_COMMUNITY): Payer: Commercial Managed Care - HMO

## 2022-11-22 ENCOUNTER — Ambulatory Visit (HOSPITAL_COMMUNITY): Payer: Commercial Managed Care - HMO

## 2022-12-19 ENCOUNTER — Encounter (HOSPITAL_COMMUNITY): Payer: Self-pay

## 2022-12-19 ENCOUNTER — Ambulatory Visit (HOSPITAL_COMMUNITY)
Admission: RE | Admit: 2022-12-19 | Discharge: 2022-12-19 | Disposition: A | Discharge status: Home / Self Care | Payer: Commercial Managed Care - HMO | Source: Ambulatory Visit | Attending: Emergency Medicine | Admitting: Emergency Medicine

## 2022-12-19 VITALS — BP 138/88 | HR 81 | Temp 99.1°F | Resp 16

## 2022-12-19 DIAGNOSIS — N898 Other specified noninflammatory disorders of vagina: Secondary | ICD-10-CM | POA: Insufficient documentation

## 2022-12-19 DIAGNOSIS — Z113 Encounter for screening for infections with a predominantly sexual mode of transmission: Secondary | ICD-10-CM | POA: Insufficient documentation

## 2022-12-19 MED ORDER — FLUCONAZOLE 150 MG PO TABS: 150.0000 mg | ORAL_TABLET | Freq: Once | ORAL | 0 refills | Status: DC | PRN

## 2022-12-19 NOTE — ED Triage Notes (Addendum)
Patient has made an appt for vaginal issues, but mentioned to patient access concerns for hives on back of tongue.  Patient denies any breathing issues, is drinking and eating.  Patient noticed this last night, sensation comes and goes.  Patient is questioning if related to vaginal complaint, anxiety.  No history of this response/reaction.  Patient is calm, making eye contact, skin warm and dry, speaking in complete sentences.

## 2022-12-19 NOTE — Discharge Instructions (Signed)
We will call you if anything on your swab returns positive. Please abstain from sexual intercourse until your results return.  Please follow up with OB/GYN

## 2022-12-19 NOTE — ED Provider Notes (Signed)
[facility]    CSN: [id number] Arrival date & time: 12/19/22  1514     History   Chief Complaint Chief Complaint  Patient presents with   Vaginal Discharge    White discharge with no smell just happen after period just need to get a std check up - Entered by patient    HPI Kelsey Ramsey is a 27 y.o. female.  Presents for STD testing Reports white vaginal discharge that began after her period A little itchy. No odor No known STD exposure  Reports abx use last month, hx yeast infection with this.   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      Home Medications    Prior to Admission medications   Medication Sig Start Date End Date Taking? Authorizing Provider  albuterol (VENTOLIN HFA) 108 (90 Base) MCG/ACT inhaler Inhale 2 puffs into the lungs every 4 (four) hours as  needed. 03/31/22  Yes [provider]  fluconazole (DIFLUCAN) 150 MG tablet Take 1 tablet (150 mg total) by mouth once as needed for up to 2 doses (take one pill on day 1, and the second pill 3 days later). 12/19/22  Yes Shamarra Warda, Wells Guiles, PA-C  cyclobenzaprine (FLEXERIL) 10 MG tablet Take one every 12 hours for pain not relieve 10/31/22   Milton Ferguson, MD  ibuprofen (ADVIL) 800 MG tablet Take 1 tablet (800 mg total) by mouth every 8 (eight) hours as needed for moderate pain. 10/31/22   Milton Ferguson, MD  losartan (COZAAR) 50 MG tablet Take 1 tablet (50 mg total) by mouth daily. 08/05/21 08/11/22  Lynden Oxford Scales, PA-C  montelukast (SINGULAIR) 10 MG tablet Take 1 tablet (10 mg total) by mouth at bedtime. 08/05/21 08/11/22  Lynden Oxford Scales, PA-C  medroxyPROGESTERone (DEPO-PROVERA) 150 MG/ML injection Inject 1 mL (150 mg total) into the muscle every 3 (three) months. Patient not taking: Reported on 09/26/2019 12/28/18 12/06/19  Chancy Milroy, MD    Family History Family History  Problem Relation Age of Onset   Diabetes Paternal Grandfather    Stroke Maternal Grandmother    Cancer Maternal Grandfather    Diabetes Maternal Grandfather    Hypertension Mother    Heart disease Mother    Thyroid disease Mother    Kidney Stones Maternal Uncle     Social History Social History   Tobacco Use   Smoking status:  Some Days    Types: Cigars   Smokeless tobacco: Never   Tobacco comments:    rare  Vaping Use   Vaping Use: Never used  Substance Use Topics   Alcohol use: Yes    Comment: socially   Drug use: No     Allergies   Pineapple   Review of Systems Review of Systems  Genitourinary:  Positive for vaginal discharge.   As per HPI  Physical Exam Triage Vital Signs ED Triage Vitals [12/19/22 1622]  Enc Vitals Group     BP (!) 158/93     Pulse Rate 81     Resp 16     Temp 99.1 F (37.3 C)     Temp Source Oral     SpO2 100 %     Weight      Height      Head  Circumference      Peak Flow      Pain Score      Pain Loc      Pain Edu?      Excl. in [place]?    No data found.  Updated Vital Signs BP 138/88 Comment: obtained by Les Pou, PA  Pulse 81   Temp 99.1 F (37.3 C) (Oral)   Resp 16   LMP 12/02/2022   SpO2 100%     Physical Exam Vitals and nursing note reviewed.  Constitutional:      General: She is not in acute distress.    Appearance: Normal appearance.  HENT:     Mouth/Throat:     Mouth: Mucous membranes are moist. No oral lesions.     Pharynx: Oropharynx is clear. No posterior oropharyngeal erythema.     Comments: Tongue with some residue. Patient was concerned about bumps that are actually normal anatomy papillae  Cardiovascular:     Rate and Rhythm: Normal rate and regular rhythm.     Pulses: Normal pulses.  Pulmonary:     Effort: Pulmonary effort is normal.  Skin:    General: Skin is warm and dry.  Neurological:     Mental Status: She is alert and oriented to person, place, and time.     UC Treatments / Results  Labs (all labs ordered are listed, but only abnormal results are displayed) Labs Reviewed  CERVICOVAGINAL ANCILLARY ONLY    EKG  Radiology No results found.  Procedures Procedures (including critical care time)  Medications Ordered in UC Medications - No data to display  Initial Impression / Assessment and Plan / UC Course  I have reviewed the triage vital signs and the nursing notes.  Pertinent labs & imaging results that were available during my care of the patient were reviewed by me and considered in my medical decision making (see chart for details).  Cytology swab pending. Treat positive result if indicated Cover for yeast with 2 dose fluconazole given recent abx use and her history of infection with this. Recommend follow up with ob/gyn  No concerns about tongue. Normal anatomy. Recommend to brush teeth and tongue twice daily to remove residue. No concern for thrush.  Return  precautions discussed. Patient agrees to plan  Final Clinical Impressions(s) / UC Diagnoses   Final diagnoses:  Vaginal discharge  Screen for STD (sexually transmitted disease)     Discharge Instructions      We will call you if anything on your swab returns positive. Please abstain from sexual intercourse until your results return.  Please follow up with OB/GYN  ED Prescriptions     Medication Sig Dispense Auth. Provider   fluconazole (DIFLUCAN) 150 MG tablet Take 1 tablet (150 mg total) by mouth once as needed for up to 2 doses (take one pill on day 1, and the second pill 3 days later). 2 tablet Geniene List, Lurena Joiner, PA-C      PDMP not reviewed this encounter.   [place]heton Viramontes, Lurena Joiner, [place] 12/19/22 1707

## 2022-12-21 LAB — CERVICOVAGINAL ANCILLARY ONLY
Bacterial Vaginitis (gardnerella): NEGATIVE
Candida Glabrata: NEGATIVE
Candida Vaginitis: POSITIVE — AB
Chlamydia: NEGATIVE
Comment: NEGATIVE
Comment: NEGATIVE
Comment: NEGATIVE
Comment: NEGATIVE
Comment: NEGATIVE
Comment: NORMAL
Neisseria Gonorrhea: NEGATIVE
Trichomonas: NEGATIVE

## 2023-01-07 ENCOUNTER — Ambulatory Visit (HOSPITAL_COMMUNITY)
Admission: RE | Admit: 2023-01-07 | Discharge: 2023-01-07 | Disposition: A | Discharge status: Home / Self Care | Payer: Commercial Managed Care - HMO | Source: Ambulatory Visit | Attending: Emergency Medicine | Admitting: Emergency Medicine

## 2023-01-07 ENCOUNTER — Encounter (HOSPITAL_COMMUNITY): Payer: Self-pay

## 2023-01-07 VITALS — BP 128/85 | HR 76 | Temp 98.4°F | Resp 16

## 2023-01-07 DIAGNOSIS — H6122 Impacted cerumen, left ear: Secondary | ICD-10-CM | POA: Diagnosis not present

## 2023-01-07 DIAGNOSIS — Z113 Encounter for screening for infections with a predominantly sexual mode of transmission: Secondary | ICD-10-CM | POA: Diagnosis not present

## 2023-01-07 DIAGNOSIS — Z3202 Encounter for pregnancy test, result negative: Secondary | ICD-10-CM | POA: Diagnosis not present

## 2023-01-07 DIAGNOSIS — H66002 Acute suppurative otitis media without spontaneous rupture of ear drum, left ear: Secondary | ICD-10-CM | POA: Insufficient documentation

## 2023-01-07 LAB — POC URINE PREG, ED: Preg Test, Ur: NEGATIVE

## 2023-01-07 MED ORDER — CARBAMIDE PEROXIDE 6.5 % OT SOLN
OTIC | Status: AC
  Filled 2023-01-07: qty 15

## 2023-01-07 MED ORDER — CARBAMIDE PEROXIDE 6.5 % OT SOLN
5.0000 [drp] | Freq: Once | OTIC | Status: AC
  Administered 2023-01-07: 5 [drp] via OTIC

## 2023-01-07 MED ORDER — CEFDINIR 300 MG PO CAPS: 300.0000 mg | ORAL_CAPSULE | Freq: Two times a day (BID) | ORAL | 0 refills | Status: AC

## 2023-01-07 NOTE — ED Provider Notes (Addendum)
[place]    CSN: [id number] Arrival date & time: 01/07/23  1458    HISTORY   Chief Complaint  Patient presents with   Migraine    Entered by patient   HPI Kelsey Ramsey is a pleasant, 27 y.o. female who presents to urgent care today. Patient complains of headaches for the past 2 weeks over both eyes.  Patient states she has not take anything for headaches at home.  Patient states she is also having white vaginal discharge after finishing her period.  Patient denies vision changes, history of hypertension, chest pain, shortness of breath, dizziness, tinnitus, aura, nausea.  The history is provided by the patient.   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    Home Medications    Prior to Admission medications   Medication Sig Start Date End Date Taking? Authorizing Provider  albuterol (VENTOLIN HFA) 108 (90 Base) MCG/ACT inhaler Inhale 2 puffs into the lungs every 4 (four) hours as needed. 03/31/22    [provider]  cyclobenzaprine (FLEXERIL) 10 MG tablet Take one every 12 hours for pain not relieve 10/31/22   Milton Ferguson, MD  ibuprofen (ADVIL) 800 MG tablet Take 1 tablet (800 mg total) by mouth every 8 (eight) hours as needed for moderate pain. 10/31/22   Milton Ferguson, MD  losartan (COZAAR) 50 MG tablet Take 1 tablet (50 mg total) by mouth daily. 08/05/21 08/11/22  Lynden Oxford Scales, PA-C  montelukast (SINGULAIR) 10 MG tablet Take 1 tablet (10 mg total) by mouth at bedtime. 08/05/21 08/11/22  Lynden Oxford Scales, PA-C  medroxyPROGESTERone (DEPO-PROVERA) 150 MG/ML injection Inject 1 mL (150 mg total) into the muscle every 3 (three) months. Patient not taking: Reported on 09/26/2019 12/28/18 12/06/19  Chancy Milroy, MD    Family History Family History  Problem Relation Age of Onset   Diabetes Paternal Grandfather    Stroke Maternal Grandmother    Cancer Maternal Grandfather    Diabetes Maternal Grandfather    Hypertension Mother    Heart disease Mother    Thyroid disease Mother    Kidney Stones Maternal Uncle    Social History Social History   Tobacco Use   Smoking status: Some Days    Types: Cigars   Smokeless tobacco: Never   Tobacco comments:    rare  Vaping Use   Vaping Use: Never used  Substance Use Topics   Alcohol use: Yes    Comment: socially   Drug use: No   Allergies   Pineapple  Review of Systems Review of  Systems Pertinent findings revealed after performing a 14 point review of systems has been noted in the history of present illness.  Physical Exam Vital Signs BP 128/85 (BP Location: Right Arm)   Pulse 76   Temp 98.4 F (36.9 C) (Oral)   Resp 16   LMP 12/29/2022 (Exact Date)   SpO2 96%   No data found.  Physical Exam Vitals and nursing note reviewed.  Constitutional:      General: She is not in acute distress.    Appearance: Normal appearance. She is not ill-appearing.  HENT:     Head: Normocephalic and atraumatic.      Salivary Glands: Right salivary gland is not diffusely enlarged or tender. Left salivary gland is not diffusely enlarged or tender.     Right Ear: Hearing, ear canal and external ear normal. No drainage. A middle ear effusion is present. There is no impacted cerumen. Tympanic membrane is injected. Tympanic membrane is not erythematous or bulging.     Left Ear: Hearing and external ear normal. There is impacted cerumen.     Nose: Nose normal. No nasal deformity, septal deviation, mucosal edema, congestion or rhinorrhea.     Right Turbinates: Not enlarged, swollen or pale.     Left Turbinates: Not enlarged, swollen or pale.     Right Sinus: No maxillary sinus tenderness or frontal sinus tenderness.     Left Sinus: No maxillary sinus tenderness or frontal sinus tenderness.     Mouth/Throat:     Lips: Pink. No lesions.     Mouth: Mucous membranes are moist. No oral lesions.     Pharynx: Oropharynx is clear. Uvula midline. No posterior oropharyngeal erythema or uvula swelling.     Tonsils: No tonsillar exudate. 0 on the right. 0 on the left.  Eyes:     General: Lids are normal.        Right eye: No discharge.        Left eye: No discharge.     Extraocular Movements: Extraocular movements intact.     Conjunctiva/sclera: Conjunctivae normal.     Right eye: Right conjunctiva is not injected.     Left eye: Left conjunctiva is not injected.  Neck:     Trachea: Trachea and phonation normal.  Cardiovascular:     Rate and Rhythm: Normal rate and regular rhythm.     Pulses: Normal pulses.     Heart sounds: Normal heart sounds. No murmur heard.    No friction rub. No gallop.  Pulmonary:     Effort: Pulmonary effort is normal. No accessory muscle usage, prolonged expiration or respiratory distress.     Breath sounds: Normal breath sounds. No stridor, decreased air movement or transmitted upper airway sounds. No decreased breath sounds, wheezing, rhonchi or rales.  Chest:     Chest wall: No  tenderness.  Musculoskeletal:        General: Normal range of motion.     Cervical back: Normal range of motion and neck supple. Normal range of motion.  Lymphadenopathy:     Cervical: No cervical adenopathy.  Skin:    General: Skin is warm and dry.     Findings: No erythema or rash.  Neurological:     General: No focal deficit present.     Mental Status: She is alert and oriented to person, place, and time.  Psychiatric:        Mood and Affect: Mood normal.        Behavior: Behavior normal.  Visual Acuity Right Eye Distance:   Left Eye Distance:   Bilateral Distance:    Right Eye Near:   Left Eye Near:    Bilateral Near:     UC Couse / Diagnostics / Procedures:     Radiology No results found.  Procedures Ear Cerumen Removal  Date/Time: 01/07/2023 5:03 PM  Performed by: Lynden Oxford Scales, PA-C Authorized by: Lynden Oxford Scales, PA-C   Consent:    Consent obtained:  Verbal   Consent given by:  Patient   Risks, benefits, and alternatives were discussed: yes     Risks discussed:  Bleeding, dizziness, infection, incomplete removal, pain and TM perforation   Alternatives discussed:  Referral, observation, alternative treatment, delayed treatment and no treatment Universal protocol:    Procedure explained and questions answered to patient or proxy's satisfaction: yes     Patient identity confirmed:  Verbally with patient and arm band Procedure details:    Location:  L ear   Procedure type: irrigation     Procedure outcomes: cerumen removed   Post-procedure details:    Inspection:  No bleeding, ear canal clear and TM intact   Hearing quality:  Normal   Procedure completion:  Tolerated  (including critical care time) EKG  Pending results:  Labs Reviewed  POCT URINE PREGNANCY  POC URINE PREG, ED  CERVICOVAGINAL ANCILLARY ONLY    Medications Ordered in UC: Medications  carbamide peroxide (DEBROX) 6.5 % OTIC (EAR) solution 5 drop (5 drops Left EAR  Given 01/07/23 1637)    UC Diagnoses / Final Clinical Impressions(s)   I have reviewed the triage vital signs and the nursing notes.  Pertinent labs & imaging results that were available during my care of the patient were reviewed by me and considered in my medical decision making (see chart for details).    Final diagnoses:  Non-recurrent acute suppurative otitis media of left ear without spontaneous rupture of tympanic membrane  Pregnancy test was negative, performed in case patient needed injections for migraine. Repeat examination of left TM after cerumen removal revealed bulging TM with erythema, injection and effusion.  Patient will be treated empirically for presumed bacterial infection in left inner ear for 10 days with cefdinir.  Patient advised to take ibuprofen for pain.  Return precautions advised.  Please see discharge instructions below for further details of plan of care as provided to patient. ED Prescriptions     Medication Sig Dispense Auth. Provider   cefdinir (OMNICEF) 300 MG capsule Take 1 capsule (300 mg total) by mouth 2 (two) times daily for 10 days. 20 capsule Lynden Oxford Scales, PA-C      PDMP not reviewed this encounter.  Disposition Upon Discharge:  Condition: stable for discharge home Home: take medications as prescribed; routine discharge instructions as discussed; follow up as advised.  Patient presented with an acute illness with associated systemic symptoms and significant discomfort requiring urgent management. In my opinion, this is a condition that a prudent lay person (someone who possesses an average knowledge of health and medicine) may potentially expect to result in complications if not addressed urgently such as respiratory distress, impairment of bodily function or dysfunction of bodily organs.   Routine symptom specific, illness specific and/or disease specific instructions were discussed with the patient and/or caregiver at length.   As  such, the patient has been evaluated and assessed, work-up was performed and treatment was provided in alignment with urgent care protocols and evidence based medicine.  Patient/parent/caregiver has been advised that  the patient may require follow up for further testing and treatment if the symptoms continue in spite of treatment, as clinically indicated and appropriate.  If the patient was tested for COVID-19, Influenza and/or RSV, then the patient/parent/guardian was advised to isolate at home pending the results of his/her diagnostic coronavirus test and potentially longer if they're positive. I have also advised pt that if his/her COVID-19 test returns positive, it's recommended to self-isolate for at least 10 days after symptoms first appeared AND until fever-free for 24 hours without fever reducer AND other symptoms have improved or resolved. Discussed self-isolation recommendations as well as instructions for household member/close contacts as per the [facility] and [place] DHHS, and also gave patient the [facility] packet with this information.  Patient/parent/caregiver has been advised to return to the [facility] or PCP in 3-5 days if no better; to PCP or the Emergency Department if new signs and symptoms develop, or if the current signs or symptoms continue to change or worsen for further workup, evaluation and treatment as clinically indicated and appropriate  The patient will follow up with their current PCP if and as advised. If the patient does not currently have a PCP we will assist them in obtaining one.   The patient may need specialty follow up if the symptoms continue, in spite of conservative treatment and management, for further workup, evaluation, consultation and treatment as clinically indicated and appropriate.  Patient/parent/caregiver verbalized understanding and agreement of plan as discussed.  All questions were addressed during visit.  Please see discharge instructions below for further details of  plan.  Discharge Instructions:   Discharge Instructions      Please read below to learn more about the medications, dosages and frequencies that I recommend to help alleviate your symptoms and to get you feeling better soon:   Omnicef (cefdinir):  Please take one (1) dose twice daily for 10 days.  This antibiotic can cause upset stomach, this will resolve once antibiotics are complete.  You are welcome to take a probiotic, eat yogurt, take Imodium while taking this medication.  Please avoid other systemic medications such as Maalox, Pepto-Bismol or milk of magnesia as they can interfere with the body's ability to absorb the antibiotics.  Advil, Motrin (ibuprofen): This is a good anti-inflammatory medication which addresses aches, pains and inflammation of the upper airways that causes sinus and nasal congestion as well as in the lower airways which makes your cough feel tight and sometimes burn.  I recommend that you take between 400 to 600 mg every 6-8 hours as needed.      Please follow-up within the next 7-10 days either with your primary care provider or urgent care if your symptoms do not resolve.  If you do not have a primary care provider, we will assist you in finding one.        Thank you for visiting urgent care today.  We appreciate the opportunity to participate in your care.       This office note has been dictated using Museum/gallery curator.  Unfortunately, this method of dictation can sometimes lead to typographical or grammatical errors.  I apologize for your inconvenience in advance if this occurs.  Please do not hesitate to reach out to me if clarification is needed.      Lynden Oxford Scales, PA-C 01/07/23 1701    Lynden Oxford Scales, PA-C 01/07/23 1704    Lynden Oxford Scales, PA-C 01/13/23 [id number]

## 2023-01-07 NOTE — Discharge Instructions (Signed)
Please read below to learn more about the medications, dosages and frequencies that I recommend to help alleviate your symptoms and to get you feeling better soon:   Omnicef (cefdinir):  Please take one (1) dose twice daily for 10 days.  This antibiotic can cause upset stomach, this will resolve once antibiotics are complete.  You are welcome to take a probiotic, eat yogurt, take Imodium while taking this medication.  Please avoid other systemic medications such as Maalox, Pepto-Bismol or milk of magnesia as they can interfere with the body's ability to absorb the antibiotics.  Advil, Motrin (ibuprofen): This is a good anti-inflammatory medication which addresses aches, pains and inflammation of the upper airways that causes sinus and nasal congestion as well as in the lower airways which makes your cough feel tight and sometimes burn.  I recommend that you take between 400 to 600 mg every 6-8 hours as needed.      Please follow-up within the next 7-10 days either with your primary care provider or urgent care if your symptoms do not resolve.  If you do not have a primary care provider, we will assist you in finding one.        Thank you for visiting urgent care today.  We appreciate the opportunity to participate in your care.

## 2023-01-07 NOTE — ED Triage Notes (Signed)
Pt states headaches for the past 2 weeks over her eyes. States she hasn't taken anything for it at home. Also states she is having white vaginal discharge after having her period.

## 2023-01-08 LAB — CERVICOVAGINAL ANCILLARY ONLY
Bacterial Vaginitis (gardnerella): POSITIVE — AB
Candida Glabrata: NEGATIVE
Candida Vaginitis: POSITIVE — AB
Chlamydia: NEGATIVE
Comment: NEGATIVE
Comment: NEGATIVE
Comment: NEGATIVE
Comment: NEGATIVE
Comment: NEGATIVE
Comment: NORMAL
Neisseria Gonorrhea: NEGATIVE
Trichomonas: NEGATIVE

## 2023-01-11 ENCOUNTER — Telehealth (HOSPITAL_COMMUNITY): Payer: Self-pay | Admitting: Emergency Medicine

## 2023-01-11 MED ORDER — METRONIDAZOLE 500 MG PO TABS: 500.0000 mg | ORAL_TABLET | Freq: Two times a day (BID) | ORAL | 0 refills | Status: DC

## 2023-01-11 MED ORDER — FLUCONAZOLE 150 MG PO TABS: 150.0000 mg | ORAL_TABLET | Freq: Once | ORAL | 0 refills | Status: AC

## 2023-01-14 ENCOUNTER — Ambulatory Visit (HOSPITAL_COMMUNITY): Payer: Self-pay

## 2023-01-17 IMAGING — CR DG CHEST 2V
2 series · 2 of 2 positions shown · non-contrast
Comparison: Chest radiograph dated 09/12/2017.

CLINICAL DATA: Shortness of breath and cough.

EXAM:
CHEST - 2 VIEW

[chest pa]
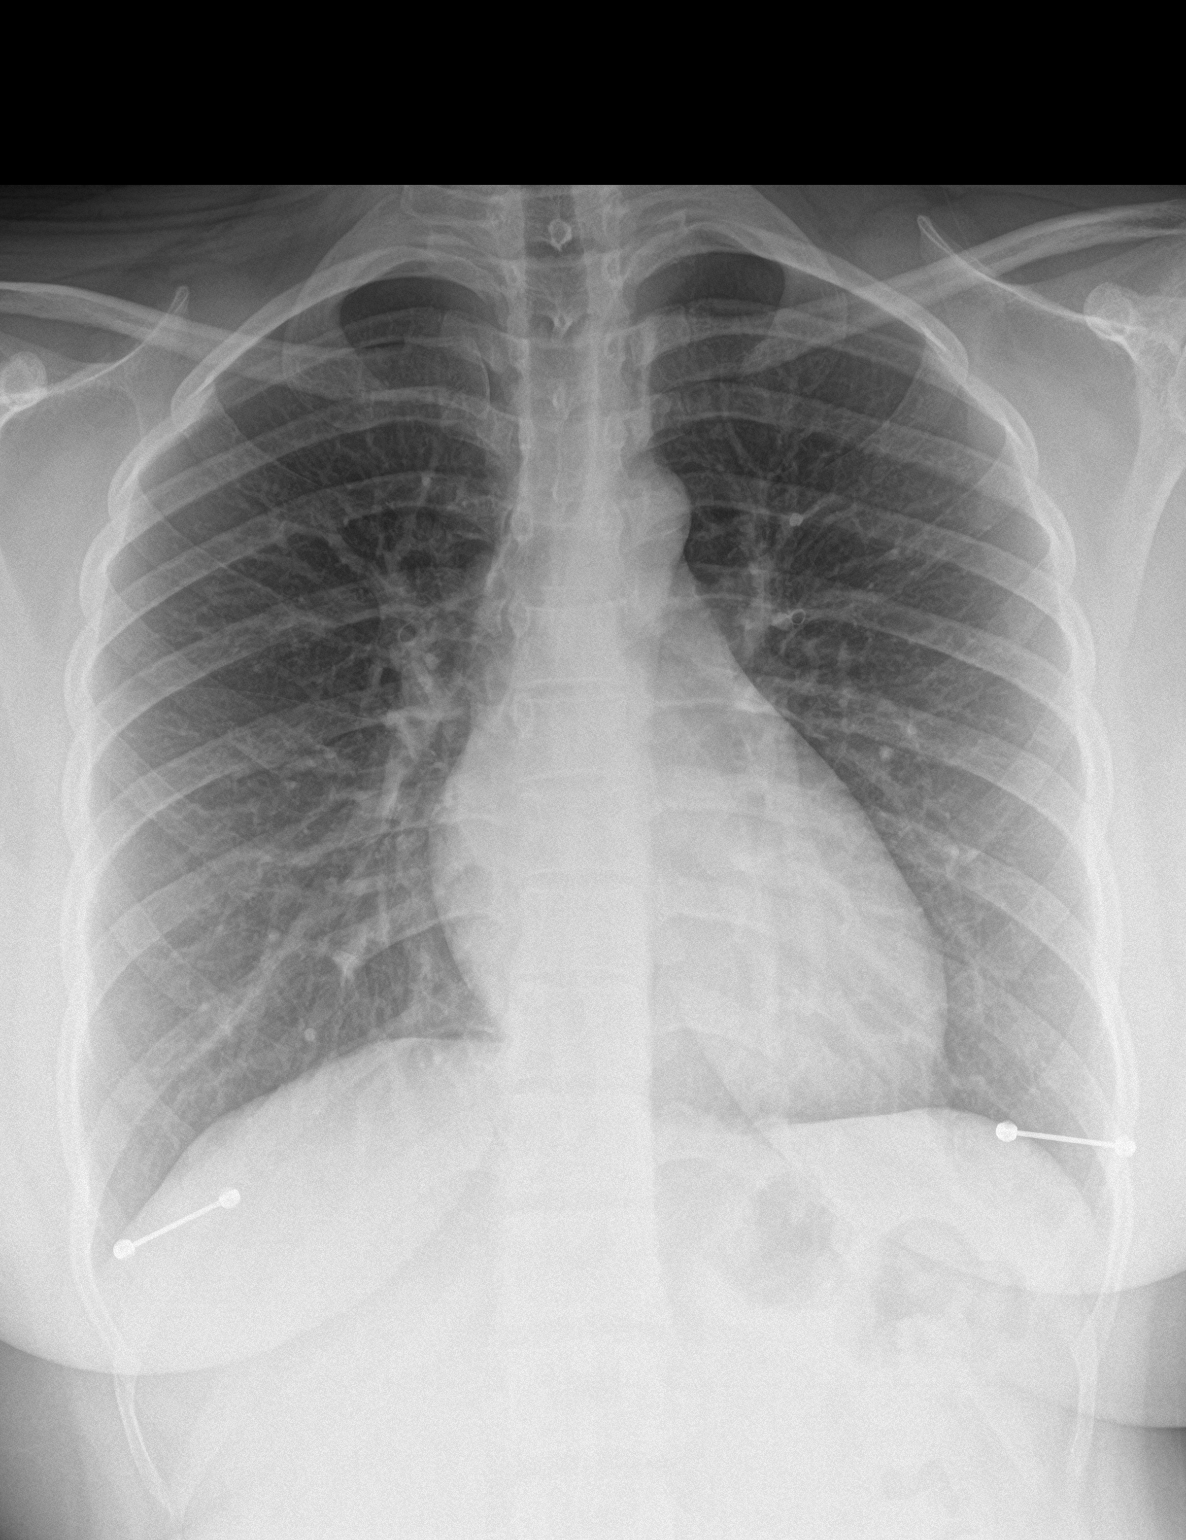

[chest lat]
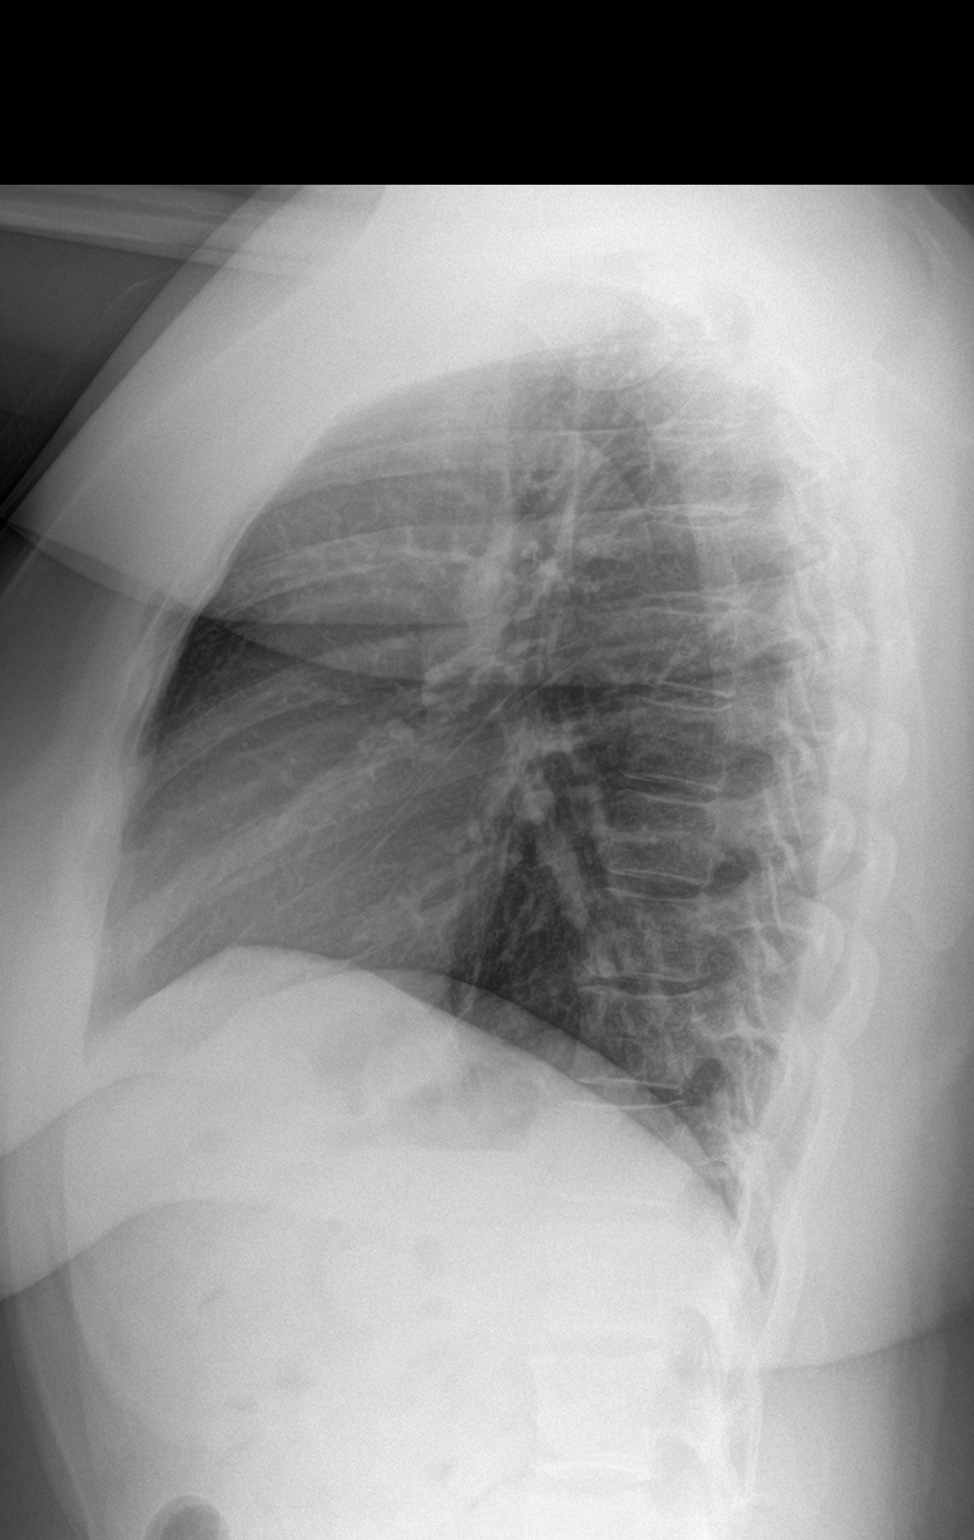

[2 of 2 positions shown; findings below may reference images not displayed]

FINDINGS: The heart size and mediastinal contours are within normal limits.
Both lungs are clear. The visualized skeletal structures are
unremarkable.
IMPRESSION: No active cardiopulmonary disease.

## 2023-02-02 ENCOUNTER — Ambulatory Visit (INDEPENDENT_AMBULATORY_CARE_PROVIDER_SITE_OTHER): Payer: Commercial Managed Care - HMO | Admitting: Obstetrics

## 2023-02-02 ENCOUNTER — Encounter: Payer: Self-pay | Admitting: Obstetrics

## 2023-02-02 ENCOUNTER — Other Ambulatory Visit (HOSPITAL_COMMUNITY)
Admission: RE | Admit: 2023-02-02 | Discharge: 2023-02-02 | Disposition: A | Discharge status: Home / Self Care | Payer: Commercial Managed Care - HMO | Source: Ambulatory Visit | Attending: Obstetrics | Admitting: Obstetrics

## 2023-02-02 ENCOUNTER — Telehealth: Payer: Self-pay | Admitting: *Deleted

## 2023-02-02 VITALS — BP 143/87 | HR 94 | Ht 67.0 in | Wt 216.1 lb

## 2023-02-02 DIAGNOSIS — Z113 Encounter for screening for infections with a predominantly sexual mode of transmission: Secondary | ICD-10-CM

## 2023-02-02 DIAGNOSIS — Z3169 Encounter for other general counseling and advice on procreation: Secondary | ICD-10-CM

## 2023-02-02 DIAGNOSIS — N898 Other specified noninflammatory disorders of vagina: Secondary | ICD-10-CM

## 2023-02-02 DIAGNOSIS — Z8611 Personal history of tuberculosis: Secondary | ICD-10-CM

## 2023-02-02 DIAGNOSIS — E669 Obesity, unspecified: Secondary | ICD-10-CM

## 2023-02-02 DIAGNOSIS — Z1339 Encounter for screening examination for other mental health and behavioral disorders: Secondary | ICD-10-CM | POA: Diagnosis not present

## 2023-02-02 DIAGNOSIS — Z01419 Encounter for gynecological examination (general) (routine) without abnormal findings: Secondary | ICD-10-CM

## 2023-02-02 DIAGNOSIS — I129 Hypertensive chronic kidney disease with stage 1 through stage 4 chronic kidney disease, or unspecified chronic kidney disease: Secondary | ICD-10-CM

## 2023-02-02 DIAGNOSIS — B9689 Other specified bacterial agents as the cause of diseases classified elsewhere: Secondary | ICD-10-CM

## 2023-02-02 DIAGNOSIS — N760 Acute vaginitis: Secondary | ICD-10-CM | POA: Diagnosis not present

## 2023-02-02 DIAGNOSIS — E66811 Obesity, class 1: Secondary | ICD-10-CM

## 2023-02-02 DIAGNOSIS — N183 Chronic kidney disease, stage 3 unspecified: Secondary | ICD-10-CM

## 2023-02-02 MED ORDER — AZO BORIC ACID 600 MG VA SUPP: 1.0000 | Freq: Every day | VAGINAL | 5 refills | Status: DC

## 2023-02-02 MED ORDER — PNV-DHA+DOCUSATE 27-1.25-300 MG PO CAPS: 1.0000 | ORAL_CAPSULE | Freq: Every day | ORAL | 4 refills | Status: AC

## 2023-02-02 NOTE — Progress Notes (Signed)
Pt presents for annual and pap. She reports vaginal discharge with irritation.

## 2023-02-02 NOTE — Telephone Encounter (Signed)
TC from pt reporting recurrent yeast and BV infections. Seeking RX. Advised pt that she has not been seen by provider since 06/2021 for annual and RX can not be sent. Further advised appt needed for problem visit for recurrent infections. Pt agreeable to scheduling appt with provider. Call transferred to schedulers.

## 2023-02-02 NOTE — Progress Notes (Signed)
Subjective:        Kelsey Ramsey is a 27 y.o. female here for a routine exam.  Current complaints: Vaginal discharge.  Wants to conceive.  PMH significant for chronic kidney disease with renal hypertension, on [place].  Personal health questionnaire:  Is patient Ashkenazi Jewish, have a family history of breast and/or ovarian cancer: no Is there a family history of uterine cancer diagnosed at age < 31, gastrointestinal cancer, urinary tract cancer, family member who is a Field seismologist syndrome-associated carrier: no Is the patient overweight and hypertensive, family history of diabetes, personal history of gestational diabetes, preeclampsia or PCOS: no Is patient over 44, have PCOS,  family history of premature CHD under age 89, diabetes, smoke, have hypertension or peripheral artery disease:  no At any time, has a partner hit, kicked or otherwise hurt or frightened you?: no Over the past 2 weeks, have you felt down, depressed or hopeless?: no Over the past 2 weeks, have you felt little interest or pleasure in doing things?:no   Gynecologic History Patient's last menstrual period was 01/31/2023 (exact date). Contraception: none Last Pap: 2020. Results were: normal Last mammogram: n/a. Results were: n/a  Obstetric History OB History  Gravida Para Term Preterm AB Living  0 0 0 0 0 0  SAB IAB Ectopic Multiple Live Births  0 0 0 0 0    Past Medical History:  Diagnosis Date   Chlamydia infection 06/27/2021   Gonorrhea    Hypertension    Nephritic syndrome    Nephrotic syndrome    Trichomonal vaginitis     Past Surgical History:  Procedure Laterality Date   RENAL BIOPSY       Current Outpatient Medications:    losartan (COZAAR) 50 MG tablet, Take 1 tablet (50 mg total) by mouth daily., Disp: 30 tablet, Rfl: 0   albuterol (VENTOLIN HFA) 108 (90 Base) MCG/ACT inhaler, Inhale 2 puffs into the lungs every 4 (four) hours as needed. (Patient not taking: Reported on 02/02/2023), Disp:  , Rfl:    cyclobenzaprine (FLEXERIL) 10 MG tablet, Take one every 12 hours for pain not relieve (Patient not taking: Reported on 02/02/2023), Disp: 20 tablet, Rfl: 0   ibuprofen (ADVIL) 800 MG tablet, Take 1 tablet (800 mg total) by mouth every 8 (eight) hours as needed for moderate pain. (Patient not taking: Reported on 02/02/2023), Disp: 21 tablet, Rfl: 0   metroNIDAZOLE (FLAGYL) 500 MG tablet, Take 1 tablet (500 mg total) by mouth 2 (two) times daily. (Patient not taking: Reported on 02/02/2023), Disp: 14 tablet, Rfl: 0   montelukast (SINGULAIR) 10 MG tablet, Take 1 tablet (10 mg total) by mouth at bedtime., Disp: 30 tablet, Rfl: 0 Allergies  Allergen Reactions   Pineapple Itching and Swelling    Social History   Tobacco Use   Smoking status: Some Days    Types: Cigars    Passive exposure: Never   Smokeless tobacco: Never   Tobacco comments:    rare  Substance Use Topics   Alcohol use: Yes    Comment: socially    Family History  Problem Relation Age of Onset   Diabetes Paternal Grandfather    Stroke Maternal Grandmother    Cancer Maternal Grandfather    Diabetes Maternal Grandfather    Hypertension Mother    Heart disease Mother    Thyroid disease Mother    Kidney Stones Maternal Uncle       Review of Systems  Constitutional: negative for fatigue and weight  loss Respiratory: negative for cough and wheezing Cardiovascular: negative for chest pain, fatigue and palpitations Gastrointestinal: negative for abdominal pain and change in bowel habits Musculoskeletal:negative for myalgias Neurological: negative for gait problems and tremors Behavioral/Psych: negative for abusive relationship, depression Endocrine: negative for temperature intolerance    Genitourinary:negative for abnormal menstrual periods, genital lesions, hot flashes, sexual problems and vaginal discharge Integument/breast: negative for breast lump, breast tenderness, nipple discharge and skin lesion(s)     Objective:       BP (!) 143/87   Pulse 94   Ht 5\' 7"  (1.702 m)   Wt 216 lb 1.6 oz (98 kg)   LMP 01/31/2023 (Exact Date)   BMI 33.85 kg/m  General:   Alert an no distress  Skin:   no rash or abnormalities  Lungs:   clear to auscultation bilaterally  Heart:   regular rate and rhythm, S1, S2 normal, no murmur, click, rub or gallop  Breasts:   normal without suspicious masses, skin or nipple changes or axillary nodes  Abdomen:  normal findings: no organomegaly, soft, non-tender and no hernia  Pelvis:  External genitalia: normal general appearance Urinary system: urethral meatus normal and bladder without fullness, nontender Vaginal: normal without tenderness, induration or masses Cervix: normal appearance Adnexa: normal bimanual exam Uterus: anteverted and non-tender, normal size   Lab Review Urine pregnancy test Labs reviewed yes Radiologic studies reviewed no  I have spent a total of 20 minutes of face-to-face and time, excluding clinical staff time, reviewing notes and preparing to see patient, ordering tests and/or medications, and counseling the patient.   Assessment:    1. Encounter for gynecological examination with Papanicolaou smear of cervix Rx: - Cytology - PAP( [place])  2. Vaginal discharge Rx: - Cervicovaginal ancillary only( [place])  3. Screen for STD (sexually transmitted disease)  4. Encounter for preconception consultation Rx: - Prenat-FeFum-DSS-FA-DHA w/o A (PNV-DHA+DOCUSATE) 27-1.25-300 MG CAPS; Take 1 capsule by mouth daily before breakfast.  Dispense: 90 capsule; Refill: 4  5. BV (bacterial vaginosis) Rx: - Boric Acid Vaginal (AZO BORIC ACID) 600 MG SUPP; Place 1 suppository vaginally at bedtime.  Dispense: 14 suppository; Refill: 5  6. Stage 3 chronic kidney disease, unspecified whether stage 3a or 3b CKD ([place])  7. Renal hypertension  8. Obesity (BMI 30.0-34.9)z  9. History of TB (tuberculosis)     Plan:    Education reviewed:  calcium supplements, depression evaluation, low fat, low cholesterol diet, safe sex/STD prevention, self breast exams, and weight bearing exercise. Follow up in: 1 year.    Orders Placed This Encounter  Procedures   Hepatitis B surface antigen   Hepatitis C antibody   HIV Antibody (routine testing w rflx)   RPR    Shelly Bombard, MD 02/02/2023 4:22 PM

## 2023-02-03 LAB — CERVICOVAGINAL ANCILLARY ONLY
Bacterial Vaginitis (gardnerella): POSITIVE — AB
Candida Glabrata: NEGATIVE
Candida Vaginitis: POSITIVE — AB
Chlamydia: NEGATIVE
Comment: NEGATIVE
Comment: NEGATIVE
Comment: NEGATIVE
Comment: NEGATIVE
Comment: NEGATIVE
Comment: NORMAL
Neisseria Gonorrhea: NEGATIVE
Trichomonas: NEGATIVE

## 2023-02-04 ENCOUNTER — Telehealth: Payer: Self-pay | Admitting: Emergency Medicine

## 2023-02-04 ENCOUNTER — Encounter: Payer: Self-pay | Admitting: Emergency Medicine

## 2023-02-04 ENCOUNTER — Other Ambulatory Visit: Payer: Self-pay | Admitting: Obstetrics

## 2023-02-04 NOTE — Telephone Encounter (Signed)
Attempted TC to patient. Unable to LVM. MyChart message sent informing of dx and Rx.

## 2023-02-04 NOTE — Telephone Encounter (Signed)
-----   Message from Shelly Bombard, MD sent at 02/04/2023  9:01 AM EDT ----- Boric Acid vaginal suppositories Rx for BV and yeast

## 2023-02-08 ENCOUNTER — Other Ambulatory Visit: Payer: Self-pay | Admitting: Emergency Medicine

## 2023-02-08 LAB — CYTOLOGY - PAP
Diagnosis: NEGATIVE
Diagnosis: REACTIVE

## 2023-02-08 MED ORDER — GOODSENSE PRENATAL VITAMINS 28-0.8 MG PO TABS: 1.0000 | ORAL_TABLET | Freq: Every day | ORAL | 11 refills | Status: DC

## 2023-02-15 ENCOUNTER — Encounter (HOSPITAL_COMMUNITY): Payer: Self-pay

## 2023-02-15 ENCOUNTER — Ambulatory Visit (HOSPITAL_COMMUNITY)
Admission: EM | Admit: 2023-02-15 | Discharge: 2023-02-15 | Disposition: A | Discharge status: Home / Self Care | Payer: Commercial Managed Care - HMO | Attending: Nurse Practitioner | Admitting: Nurse Practitioner

## 2023-02-15 DIAGNOSIS — G43519 Persistent migraine aura without cerebral infarction, intractable, without status migrainosus: Secondary | ICD-10-CM | POA: Diagnosis not present

## 2023-02-15 MED ORDER — BUTALBITAL-APAP-CAFFEINE 50-325-40 MG PO TABS: 1.0000 | ORAL_TABLET | Freq: Four times a day (QID) | ORAL | 0 refills | Status: DC | PRN

## 2023-02-15 MED ORDER — KETOROLAC TROMETHAMINE 60 MG/2ML IM SOLN
INTRAMUSCULAR | Status: AC
  Filled 2023-02-15: qty 2

## 2023-02-15 MED ORDER — KETOROLAC TROMETHAMINE 60 MG/2ML IM SOLN
60.0000 mg | Freq: Once | INTRAMUSCULAR | Status: AC
  Administered 2023-02-15: 60 mg via INTRAMUSCULAR

## 2023-02-15 NOTE — ED Provider Notes (Signed)
[place]    CSN: [id number] Arrival date & time: 02/15/23  1817      History   Chief Complaint Chief Complaint  Patient presents with   Migraine    HPI Kelsey Ramsey is a 27 y.o. female.    Subjective:  Kelsey Ramsey is a 27 y.o. female who presents for evaluation of headache. Symptoms began about 1 month ago. Rapidity of onset was gradual. The patient gets headaches intermittently. The headache is poorly described and is frontal in location.  The patient rates the pain a 8 on a scale from 1 to 10. Precipitating factors include none which have been determined. The headache was not preceded by an aura. 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 The patient denies depression, muscle weakness, numbness of extremities, or speech difficulties. Other associated symptoms include dizziness, fatigue, irritability, light sensitivity, phonophobia.  Patient denies fever, nausea, neck stiffness, or vomiting. Home treatment has included ibuprofen, darkening the room, resting, sleeping, and drinking lots of water with inadequate improvement.  Patient has a history of hypertension.  She did not take her medication today but reports usual compliance with her prescribed regimen.  Patient reports that her mother checks her blood pressure every day and her systolic blood pressure typically runs around 120.  Patient states that she is "tired and just needs to sleep."  Family history includes no known family members with significant headaches.   The following portions of the patient's history were reviewed and updated as appropriate: allergies, current medications, past family history, past medical history, past social history, past surgical history, and problem list.      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   Nephrotic syndrome, focal and segmental glomerular lesions 04/06/2016   Renal hypertension 04/06/2016   Subcutaneous mass 09/27/2015   Chronic kidney disease 08/10/2014   Chronic kidney disease (CKD) stage G3a/A2, moderately decreased glomerular filtration rate (GFR) between 45-59 mL/min/1.73 square meter and albuminuria creatinine ratio between 30-299 mg/g ([place]) 08/10/2014   Therapeutic drug monitoring 05/12/2012   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 Authorizing Provider  butalbital-acetaminophen-caffeine (FIORICET) 50-325-40 MG tablet Take 1 tablet by mouth every 6 (six) hours as needed for migraine. 02/15/23 02/15/24 Yes Enrique Sack, FNP  albuterol (VENTOLIN HFA) 108 (90 Base) MCG/ACT inhaler Inhale 2 puffs into the lungs every 4 (four) hours as needed. Patient not taking: Reported on 02/02/2023 03/31/22   [provider]  Boric Acid Vaginal (AZO BORIC ACID) 600 MG SUPP Place 1 suppository vaginally at bedtime. 02/02/23   Shelly Bombard, MD  losartan (COZAAR) 50 MG tablet Take 1 tablet (50 mg total) by mouth daily. 08/05/21 02/02/23  Lynden Oxford Scales, PA-C  montelukast (SINGULAIR) 10 MG tablet Take 1 tablet (10 mg total) by mouth at bedtime. 08/05/21 08/11/22  Lynden Oxford Scales, PA-C  Prenat-FeFum-DSS-FA-DHA w/o A (PNV-DHA+DOCUSATE) 27-1.25-300 MG CAPS  Take 1 capsule by mouth daily before breakfast. 02/02/23   Shelly Bombard, MD  Prenatal Vit-Fe Fumarate-FA (GOODSENSE PRENATAL VITAMINS) 28-0.8 MG TABS Take 1 tablet by mouth daily. 02/08/23   Constant, Peggy, MD  medroxyPROGESTERone (DEPO-PROVERA) 150 MG/ML injection Inject 1 mL (150 mg total) into the muscle every 3 (three) months. Patient not taking: Reported on 09/26/2019 12/28/18 12/06/19  Chancy Milroy, MD    Family History Family History  Problem Relation Age of Onset   Diabetes Paternal Grandfather    Stroke Maternal Grandmother    Cancer Maternal Grandfather    Diabetes Maternal Grandfather    Hypertension Mother    Heart disease Mother    Thyroid disease Mother    Kidney Stones Maternal Uncle     Social History Social History   Tobacco Use   Smoking status: Some Days    Types: Cigars    Passive exposure: Never   Smokeless tobacco: Never   Tobacco comments:    rare  Vaping Use   Vaping Use: Never used  Substance Use Topics   Alcohol use: Yes    Comment: socially   Drug use: No     Allergies   Pineapple   Review of Systems Review of Systems  Constitutional:  Negative for fever.  Eyes:  Positive for photophobia. Negative for visual disturbance.  Cardiovascular:  Negative for chest pain.  Gastrointestinal:  Negative for nausea and vomiting.  Musculoskeletal:  Negative for neck pain and neck stiffness.  Neurological:  Positive for dizziness and headaches.  All other systems reviewed and are negative.    Physical Exam Triage Vital Signs ED Triage Vitals [02/15/23 2018]  Enc Vitals Group     BP (!) 152/87     Pulse Rate 69     Resp 18     Temp 98.5 F (36.9 C)     Temp Source Oral     SpO2 98 %     Weight      Height      Head Circumference      Peak Flow      Pain Score 8     Pain Loc      Pain Edu?      Excl. in [place]?    No data found.  Updated Vital Signs BP (!) 152/87 (BP Location: Left Arm)   Pulse 69   Temp 98.5 F (36.9 C) (Oral)    Resp 18   LMP 01/31/2023 (Exact Date)   SpO2 98%   Visual Acuity Right Eye Distance:   Left Eye Distance:   Bilateral Distance:    Right Eye Near:   Left Eye Near:    Bilateral Near:     Physical Exam Constitutional:      General: She is not in acute distress.    Appearance: Normal appearance. She is obese. She is not ill-appearing or toxic-appearing.  HENT:     Head: Normocephalic.     Nose: Nose normal.     Mouth/Throat:     Mouth: Mucous  membranes are moist.  Eyes:     Conjunctiva/sclera: Conjunctivae normal.  Cardiovascular:     Rate and Rhythm: Normal rate.  Pulmonary:     Effort: Pulmonary effort is normal.  Musculoskeletal:        General: Normal range of motion.     Cervical back: Normal range of motion and neck supple. No rigidity or tenderness.  Lymphadenopathy:     Cervical: No cervical adenopathy.  Skin:    General: Skin is warm and dry.  Neurological:     General: No focal deficit present.     Mental Status: She is alert and oriented to person, place, and time.     Sensory: No sensory deficit.     Motor: No weakness.     Coordination: Coordination normal.     Gait: Gait normal.  Psychiatric:        Mood and Affect: Mood normal.        Behavior: Behavior normal.      UC Treatments / Results  Labs (all labs ordered are listed, but only abnormal results are displayed) Labs Reviewed - No data to display  EKG   Radiology No results found.  Procedures Procedures (including critical care time)  Medications Ordered in UC Medications  ketorolac (TORADOL) injection 60 mg (60 mg Intramuscular Given 02/15/23 2049)    Initial Impression / Assessment and Plan / UC Course  I have reviewed the triage vital signs and the nursing notes.  Pertinent labs & imaging results that were available during my care of the patient were reviewed by me and considered in my medical decision making (see chart for details).    27 year old female with a history of  hypertension presenting with intermittent headache for the past month.  Patient is nontoxic.  Afebrile.  Hypertensive with a blood pressure of 152/87.  She has a history of hypertension and has not taken her medication today.  There is no indication of hypertensive urgency or hypertensive emergency.  No red flag headache symptoms noted.  No focal deficits noted on exam.  Toradol given in the clinic.  Patient prescribed Fioricet. Education regarding headaches was given. Headache diary recommended. Importance of adequate hydration discussed. Discussed lifestyle issues (diet, sleep, exercise).  ED precautions discussed.  Today's evaluation has revealed no signs of a dangerous process. Discussed diagnosis with patient and/or guardian. Patient and/or guardian aware of their diagnosis, possible red flag symptoms to watch out for and need for close follow up. Patient and/or guardian understands verbal and written discharge instructions. Patient and/or guardian comfortable with plan and disposition.  Patient and/or guardian has a clear mental status at this time, good insight into illness (after discussion and teaching) and has clear judgment to make decisions regarding their care  Documentation was completed with the aid of voice recognition software. Transcription may contain typographical errors. 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 Migraine headaches may also cause other symptoms, such as nausea, vomiting, and sensitivity to light and noise. A migraine headache can last from hours to days.   Take prescribed medication as needed for migraine.  Do not drink alcohol. Do not use any products that contain nicotine or tobacco.  Get 7-9 hours of sleep each  night Find ways  to manage stress, such as meditation, deep breathing, or yoga.  Go to the ED immediately if:  Your migraine headache becomes severe  You have a fever or stiff neck. You have vision loss. Your muscles feel weak or like you cannot control them. You lose your balance often or have trouble walking. You faint. You have a seizure.     ED Prescriptions     Medication Sig Dispense Auth. Provider   butalbital-acetaminophen-caffeine (FIORICET) 50-325-40 MG tablet Take 1 tablet by mouth every 6 (six) hours as needed for migraine. 20 tablet Enrique Sack, FNP      PDMP not reviewed this encounter.   Enrique Sack, [place] 02/15/23 2051

## 2023-02-15 NOTE — Discharge Instructions (Signed)
A migraine headache is an intense pulsing or throbbing pain on one or both sides of the head. Migraine headaches may also cause other symptoms, such as nausea, vomiting, and sensitivity to light and noise. A migraine headache can last from hours to days.   Take prescribed medication as needed for migraine.  Do not drink alcohol. Do not use any products that contain nicotine or tobacco.  Get 7-9 hours of sleep each night Find ways to manage stress, such as meditation, deep breathing, or yoga.  Go to the ED immediately if:  Your migraine headache becomes severe  You have a fever or stiff neck. You have vision loss. Your muscles feel weak or like you cannot control them. You lose your balance often or have trouble walking. You faint. You have a seizure.

## 2023-02-15 NOTE — ED Triage Notes (Addendum)
Pt c/o migraine headache x1 month. States thinks it d/t her on 3rd shift with bright lights and noise. Denies taking any meds. States having frontal pain also. Pt states she needs a work note.

## 2023-05-03 ENCOUNTER — Ambulatory Visit (HOSPITAL_COMMUNITY): Payer: Self-pay

## 2023-05-05 ENCOUNTER — Encounter (HOSPITAL_COMMUNITY): Payer: Self-pay

## 2023-05-05 ENCOUNTER — Ambulatory Visit (HOSPITAL_COMMUNITY)
Admission: RE | Admit: 2023-05-05 | Discharge: 2023-05-05 | Disposition: A | Discharge status: Home / Self Care | Payer: Commercial Managed Care - HMO | Source: Ambulatory Visit | Attending: Family Medicine | Admitting: Family Medicine

## 2023-05-05 VITALS — BP 128/84 | HR 73 | Temp 98.2°F | Resp 18

## 2023-05-05 DIAGNOSIS — F32A Depression, unspecified: Secondary | ICD-10-CM | POA: Insufficient documentation

## 2023-05-05 DIAGNOSIS — Z113 Encounter for screening for infections with a predominantly sexual mode of transmission: Secondary | ICD-10-CM | POA: Diagnosis not present

## 2023-05-05 LAB — POCT URINE PREGNANCY: Preg Test, Ur: NEGATIVE

## 2023-05-05 MED ORDER — ESCITALOPRAM OXALATE 10 MG PO TABS: 10.0000 mg | ORAL_TABLET | Freq: Every day | ORAL | 2 refills | Status: DC

## 2023-05-05 NOTE — Discharge Instructions (Addendum)
We have sent testing for sexually transmitted infections. We will notify you of any positive results once they are received. If required, we will prescribe any medications you might need.  Please refrain from all sexual activity for at least the next seven days.  

## 2023-05-05 NOTE — ED Triage Notes (Signed)
Pt states she has been off her depression meds since the beginning of the year. She states for the last month she has been having eye twitching, sharp pains that come and go in her chest, decreased appetite, depressed and anxious just crying for no reason.   She states she has a PCP and she would have seen them but she didn't know she had insurance until she checked in here she is going to call them now.   She is worried about her boyfriend cheating and would like pregnancy test as well as cyto done.

## 2023-05-06 ENCOUNTER — Encounter (HOSPITAL_COMMUNITY): Payer: Self-pay | Admitting: Internal Medicine

## 2023-05-06 ENCOUNTER — Telehealth: Payer: Commercial Managed Care - HMO | Admitting: Family Medicine

## 2023-05-06 ENCOUNTER — Telehealth (HOSPITAL_COMMUNITY): Payer: Self-pay | Admitting: Internal Medicine

## 2023-05-06 DIAGNOSIS — B3731 Acute candidiasis of vulva and vagina: Secondary | ICD-10-CM

## 2023-05-06 DIAGNOSIS — A599 Trichomoniasis, unspecified: Secondary | ICD-10-CM

## 2023-05-06 DIAGNOSIS — B9689 Other specified bacterial agents as the cause of diseases classified elsewhere: Secondary | ICD-10-CM

## 2023-05-06 DIAGNOSIS — N760 Acute vaginitis: Secondary | ICD-10-CM | POA: Diagnosis not present

## 2023-05-06 LAB — CERVICOVAGINAL ANCILLARY ONLY
Bacterial Vaginitis (gardnerella): POSITIVE — AB
Candida Glabrata: NEGATIVE
Candida Vaginitis: POSITIVE — AB
Chlamydia: NEGATIVE
Comment: NEGATIVE
Comment: NEGATIVE
Comment: NEGATIVE
Comment: NEGATIVE
Comment: NEGATIVE
Comment: NORMAL
Neisseria Gonorrhea: NEGATIVE
Trichomonas: POSITIVE — AB

## 2023-05-06 MED ORDER — METRONIDAZOLE 500 MG PO TABS: 500.0000 mg | ORAL_TABLET | Freq: Two times a day (BID) | ORAL | 0 refills | Status: AC

## 2023-05-06 MED ORDER — FLUCONAZOLE 150 MG PO TABS: 150.0000 mg | ORAL_TABLET | ORAL | 0 refills | Status: DC

## 2023-05-06 NOTE — Patient Instructions (Signed)
[name], thank you for joining Freddy Finner, NP for today's virtual visit.  While this provider is not your primary care provider (PCP), if your PCP is located in our provider database this encounter information will be shared with them immediately following your visit.   A [facility] MyChart account gives you access to today's visit and all your visits, tests, and labs performed at [facility] " click here if you don't have a [facility] MyChart account or go to [email or web address]  Consent: (Patient) [name] provided verbal consent for this virtual visit at the beginning of the encounter.  Current Medications:  Current Outpatient Medications:    fluconazole (DIFLUCAN) 150 MG tablet, Take 1 tablet (150 mg total) by mouth as directed. Repeat in 3 days if needed, Disp: 2 tablet, Rfl: 0   metroNIDAZOLE (FLAGYL) 500 MG tablet, Take 1 tablet (500 mg total) by mouth 2 (two) times daily for 7 days., Disp: 14 tablet, Rfl: 0   escitalopram (LEXAPRO) 10 MG tablet, Take 1 tablet (10 mg total) by mouth daily., Disp: 30 tablet, Rfl: 2   losartan (COZAAR) 50 MG tablet, Take 1 tablet (50 mg total) by mouth daily., Disp: 30 tablet, Rfl: 0   Prenat-FeFum-DSS-FA-DHA w/o A (PNV-DHA+DOCUSATE) 27-1.25-300 MG CAPS, Take 1 capsule by mouth daily before breakfast., Disp: 90 capsule, Rfl: 4   Medications ordered in this encounter:  Meds ordered this encounter  Medications   metroNIDAZOLE (FLAGYL) 500 MG tablet    Sig: Take 1 tablet (500 mg total) by mouth 2 (two) times daily for 7 days.    Dispense:  14 tablet    Refill:  0    Order Specific Question:   Supervising Provider    Answer:   Merrilee Jansky [[id number]]   fluconazole (DIFLUCAN) 150 MG tablet    Sig: Take 1 tablet (150 mg total) by mouth as directed. Repeat in 3 days if needed    Dispense:  2 tablet    Refill:  0    Order Specific Question:   Supervising Provider    Answer:   Merrilee Jansky [[id number]]     *If  you need refills on other medications prior to your next appointment, please contact your pharmacy*  Follow-Up: Call back or seek an in-person evaluation if the symptoms worsen or if the condition fails to improve as anticipated.  [facility] [phone]  Other Instructions Trichomoniasis Trichomoniasis is a sexually transmitted infection (STI). Many people with trichomoniasis do not have any symptoms (are asymptomatic) or have only minimal symptoms. Untreated trichomoniasis can last from months to years. This condition is treated with medicine. What are the causes? This condition is caused by a parasite called Trichomonas vaginalis and is transmitted during sexual contact. What increases the risk? The following factors may make you more likely to develop this condition: Having unprotected sex. Having sex with a partner who has trichomoniasis. Having multiple sexual partners. Having had previous trichomoniasis infections or other STIs. What are the signs or symptoms? In females, symptoms of trichomoniasis include: Itching, burning, redness, or soreness in the genital area. Discomfort while urinating. Abnormal vaginal discharge that is clear, white, gray, or yellow-green and foamy and has an unusual fishy odor. In males, symptoms of trichomoniasis include: Discharge from the penis. Burning after urination or ejaculation. Itching or discomfort inside the penis. How is this diagnosed? This condition is diagnosed based on tests. To perform a test, your health care provider will do  one of the following: Ask you to provide a urine sample. Take a sample of discharge. The sample may be taken from the vagina or cervix in females and from the urethra in males. Your health care provider may use a swab to collect the sample. Your health care provider may test you for other STIs, including human immunodeficiency virus (HIV). How is this treated?  This condition is treated with  medicines such as metronidazole or tinidazole. These are called antimicrobial medicines, and they are taken by mouth (orally). Your sexual partner or partners also need to be tested and treated. If they have the infection and are not treated, you will likely get reinfected. If you plan to become pregnant or think you may be pregnant, tell your health care provider right away. Some medicines that are used to treat the infection should not be taken during pregnancy. Your health care provider may recommend over-the-counter medicines or creams to help relieve itching or irritation. You may be tested for the infection again 3 months after treatment. Follow these instructions at home: Medicines Take over-the-counter and prescription medicines only as told by your health care provider. Take your antimicrobial medicine as told by your health care provider. Do not stop taking it even if you start to feel better. Use creams as told by your health care provider. General instructions Do not have sex until after you finish your medicine and your symptoms have resolved. Do not wear tampons while you have the infection (if you are female). Talk with your sexual partner or partners about any symptoms that either of you may have, as well as any history of STIs. Keep all follow-up visits. This is important. How is this prevented?  Use condoms every time you have sex. Using condoms correctly and consistently can help protect against STIs. Do not have sexual contact if you have symptoms of trichomoniasis or another STI. Avoid having multiple sexual partners. Get tested for STIs before you have sex with a partner. Ask all partners to do the same. Do not douche (if you are female). Douching may increase your risk for getting STIs due to the removal of good bacteria in the vagina. Contact a health care provider if: You still have symptoms after you finish your medicine. You develop a rash. You plan to become  pregnant or think you may be pregnant. Summary Trichomoniasis is a sexually transmitted infection (STI). This condition often has no symptoms or minimal symptoms. Take your antimicrobial medicine as told by your health care provider. Do not stop taking even if you start to feel better. Discuss your infection with your sexual partner or partners. Make sure that all partners get tested and treated, if necessary. You should not have sex until after you finish your medicine and your symptoms have resolved. Keep all follow-up visits. This is important. This information is not intended to replace advice given to you by your health care provider. Make sure you discuss any questions you have with your health care provider. Document Revised: 10/08/2021 Document Reviewed: 10/08/2021 Elsevier Patient Education  2024 [employer]   If you have been instructed to have an in-person evaluation today at a local Urgent Care facility, please use the link below. It will take you to a list of all of our available [facility], including address, phone number and hours of operation. Please do not delay care.  [facility]  If you or a family member do not have a primary care provider,  use the link below to schedule a visit and establish care. When you choose a [facility] primary care physician or advanced practice provider, you gain a long-term partner in health. Find a Primary Care Provider  Learn more about [facility]'s in-office and virtual care options: [facility] - Get Care Now

## 2023-05-06 NOTE — Progress Notes (Signed)
Virtual Visit Consent   Kelsey Ramsey, you are scheduled for a virtual visit with a [facility] provider today. Just as with appointments in the office, your consent must be obtained to participate. Your consent will be active for this visit and any virtual visit you may have with one of our providers in the next 365 days. If you have a MyChart account, a copy of this consent can be sent to you electronically.  As this is a virtual visit, video technology does not allow for your provider to perform a traditional examination. This may limit your provider's ability to fully assess your condition. If your provider identifies any concerns that need to be evaluated in person or the need to arrange testing (such as labs, EKG, etc.), we will make arrangements to do so. Although advances in technology are sophisticated, we cannot ensure that it will always work on either your end or our end. If the connection with a video visit is poor, the visit may have to be switched to a telephone visit. With either a video or telephone visit, we are not always able to ensure that we have a secure connection.  By engaging in this virtual visit, you consent to the provision of healthcare and authorize for your insurance to be billed (if applicable) for the services provided during this visit. Depending on your insurance coverage, you may receive a charge related to this service.  I need to obtain your verbal consent now. Are you willing to proceed with your visit today? [name] has provided verbal consent on 05/06/2023 for a virtual visit (video or telephone). Freddy Finner, NP  Date: 05/06/2023 3:52 PM  Virtual Visit via Video Note   I, Freddy Finner, connected with  Kelsey Ramsey  ([id number], 12/06/1995) on 05/06/23 at  3:45 PM EDT by a video-enabled telemedicine application and verified that I am speaking with the correct person using two identifiers.  Location: Patient: Virtual Visit Location Patient:  Home Provider: Virtual Visit Location Provider: Home Office   I discussed the limitations of evaluation and management by telemedicine and the availability of in person appointments. The patient expressed understanding and agreed to proceed.    History of Present Illness: Kelsey Ramsey is a 27 y.o. who identifies as a female who was assigned female at birth, and is being seen today for STD medication  -Had testing performed at [facility] and would like to start medications now- as she reports UC has not called her and she does not want to wait to get them started. In the past she reports having to wait 3 days for meds.  Feeling liked she has a UTI, discharge, odor and itching. 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 07/02/2016   Nephrotic syndrome, focal and segmental glomerular lesions 04/06/2016   Renal hypertension 04/06/2016   Subcutaneous mass 09/27/2015   Chronic kidney disease 08/10/2014   Chronic kidney disease (CKD) stage G3a/A2, moderately decreased glomerular filtration rate (GFR) between 45-59 mL/min/1.73 square meter and albuminuria creatinine ratio between 30-299 mg/g (HCC) 08/10/2014   Therapeutic drug monitoring 05/12/2012   History of TB (tuberculosis) 02/11/2012    Allergies:  Allergies  Allergen Reactions   Pineapple Itching and Swelling  Medications:  Current Outpatient Medications:    escitalopram (LEXAPRO) 10 MG tablet, Take 1 tablet (10 mg total) by mouth daily., Disp: 30 tablet, Rfl: 2   losartan (COZAAR) 50 MG tablet, Take 1 tablet (50 mg total) by mouth daily., Disp: 30 tablet, Rfl: 0   Prenat-FeFum-DSS-FA-DHA w/o A (PNV-DHA+DOCUSATE) 27-1.25-300 MG CAPS, Take 1 capsule by mouth daily  before breakfast., Disp: 90 capsule, Rfl: 4  Observations/Objective: Patient is well-developed, well-nourished in no acute distress.  Resting comfortably  at home.  Head is normocephalic, atraumatic.  No labored breathing.  Speech is clear and coherent with logical content.  Patient is alert and oriented at baseline.    Assessment and Plan: 1. Trichomoniasis  - metroNIDAZOLE (FLAGYL) 500 MG tablet; Take 1 tablet (500 mg total) by mouth 2 (two) times daily for 7 days.  Dispense: 14 tablet; Refill: 0  2. BV (bacterial vaginosis)  - metroNIDAZOLE (FLAGYL) 500 MG tablet; Take 1 tablet (500 mg total) by mouth 2 (two) times daily for 7 days.  Dispense: 14 tablet; Refill: 0  3. Yeast infection of the vagina  - fluconazole (DIFLUCAN) 150 MG tablet; Take 1 tablet (150 mg total) by mouth as directed. Repeat in 3 days if needed  Dispense: 2 tablet; Refill: 0  -+ on testing performed yesterday- reports wanting to start meds today she is uncomfortable -has not heard from UC -Order meds based on results and symptoms reported  Reviewed side effects, risks and benefits of medication.    Patient acknowledged agreement and understanding of the plan.   Past Medical, Surgical, Social History, Allergies, and Medications have been Reviewed.    Follow Up Instructions: I discussed the assessment and treatment plan with the patient. The patient was provided an opportunity to ask questions and all were answered. The patient agreed with the plan and demonstrated an understanding of the instructions.  A copy of instructions were sent to the patient via MyChart unless otherwise noted below.   The patient was advised to call back or seek an in-person evaluation if the symptoms worsen or if the condition fails to improve as anticipated.  Time:  I spent 8 minutes with the patient via telehealth technology discussing the above problems/concerns.    Freddy Finner, NP

## 2023-05-06 NOTE — Telephone Encounter (Signed)
Patient called urgent care requesting to start treatment for positive STD swab results. After calling urgent care, went to telehealth and had an appointment where medications were sent.

## 2023-05-08 NOTE — ED Provider Notes (Signed)
[facility]   [id number] 05/05/23 Arrival Time: 1013  ASSESSMENT & PLAN:  1. Depression, unspecified depression type   2. Screening for STD (sexually transmitted disease)    No SI. Lexapro has worked well for her in the past. Meds ordered this encounter  Medications   escitalopram (LEXAPRO) 10 MG tablet    Sig: Take 1 tablet (10 mg total) by mouth daily.    Dispense:  30 tablet    Refill:  2   STD testing pending at her request.   Follow-up Information     [facility].   Specialty: Urgent Care Why: If symptoms worsen in any way. Contact information: [address] [place] [address] [phone]                Reviewed expectations re: course of current medical issues. Questions answered. Outlined signs and symptoms indicating need for more acute intervention. Patient verbalized understanding. After Visit Summary given.   SUBJECTIVE:  Kelsey Ramsey is a 27 y.o. female who states she has been off her depression meds since the beginning of the year. She states for the last month she has been having eye twitching, sharp pains that come and go in her chest, decreased appetite, depressed and anxious just crying for no reason.   She states she has a PCP and she would have seen them but she didn't know she had insurance until she checked in here she is going to call them now.   She is worried about her boyfriend cheating and would like pregnancy test as well as cyto STD testing done.    Social History   Tobacco Use  Smoking Status Former   Types: Cigars   Passive exposure: Never  Smokeless Tobacco Never  Tobacco Comments   rare   Social History   Substance and Sexual Activity  Alcohol Use Yes   Comment: socially    OBJECTIVE:  Vitals:   05/05/23 1031  BP: 128/84  Pulse: 73  Resp: 18  Temp: 98.2 F (36.8 C)  TempSrc: Oral  SpO2: 97%    General appearance: alert; appears NAD but is tearful at  times Eyes: PERRLA; EOMI; conjunctiva normal HENT: normocephalic; atraumatic Neck: supple Lungs: clear to auscultation bilaterally Heart: regular rate and rhythm Abdomen: soft, non-tender  Extremities: no edema; symmetrical with no gross deformities Skin: warm and dry Neurologic: normal gait; normal symmetric reflexes Psychological: alert and cooperative; appropriate mood; normal affect  UPT  Allergies  Allergen Reactions   Pineapple Itching and Swelling    Past Medical History:  Diagnosis Date   Chlamydia infection 06/27/2021   Gonorrhea    Hypertension    Nephritic syndrome    Nephrotic syndrome    Trichomonal vaginitis    Social History   Socioeconomic History   Marital status: Single    Spouse name: Not on file   Number of children: Not on file   Years of education: Not on file   Highest education level: Not on file  Occupational History   Not on file  Tobacco Use   Smoking status: Former    Types: Cigars    Passive exposure: Never   Smokeless tobacco: Never   Tobacco comments:    rare  Vaping Use   Vaping Use: Former  Substance and Sexual Activity   Alcohol use: Yes    Comment: socially   Drug use: No   Sexual activity: Not Currently    Birth control/protection: None  Other Topics Concern   Not on file  Social History Narrative   ** Merged History Encounter **       Social Determinants of Health   Financial Resource Strain: Not on file  Food Insecurity: Not on file  Transportation Needs: Not on file  Physical Activity: Not on file  Stress: Not on file  Social Connections: Not on file  Intimate Partner Violence: Not on file   Family History  Problem Relation Age of Onset   Diabetes Paternal Grandfather    Stroke Maternal Grandmother    Cancer Maternal Grandfather    Diabetes Maternal Grandfather    Hypertension Mother    Heart disease Mother    Thyroid disease Mother    Kidney Stones Maternal Uncle    Past Surgical History:   Procedure Laterality Date   RENAL BIOPSY         Mardella Layman, MD 05/08/23 [phone]

## 2023-07-19 ENCOUNTER — Encounter (HOSPITAL_BASED_OUTPATIENT_CLINIC_OR_DEPARTMENT_OTHER): Payer: Self-pay

## 2023-07-19 ENCOUNTER — Emergency Department (HOSPITAL_BASED_OUTPATIENT_CLINIC_OR_DEPARTMENT_OTHER)
Admission: EM | Admit: 2023-07-19 | Discharge: 2023-07-19 | Disposition: A | Discharge status: Home / Self Care | Payer: Commercial Managed Care - HMO | Attending: Emergency Medicine | Admitting: Emergency Medicine

## 2023-07-19 DIAGNOSIS — H73891 Other specified disorders of tympanic membrane, right ear: Secondary | ICD-10-CM | POA: Insufficient documentation

## 2023-07-19 DIAGNOSIS — R519 Headache, unspecified: Secondary | ICD-10-CM | POA: Diagnosis present

## 2023-07-19 LAB — PREGNANCY, URINE: Preg Test, Ur: NEGATIVE

## 2023-07-19 MED ORDER — DEXAMETHASONE 4 MG PO TABS
10.0000 mg | ORAL_TABLET | Freq: Once | ORAL | Status: AC
  Administered 2023-07-19: 10 mg via ORAL
  Filled 2023-07-19: qty 3

## 2023-07-19 MED ORDER — ONDANSETRON 4 MG PO TBDP: ORAL_TABLET | ORAL | 0 refills | Status: DC

## 2023-07-19 MED ORDER — AMOXICILLIN-POT CLAVULANATE 875-125 MG PO TABS: 1.0000 | ORAL_TABLET | Freq: Two times a day (BID) | ORAL | 0 refills | Status: DC

## 2023-07-19 MED ORDER — DIPHENHYDRAMINE HCL 50 MG/ML IJ SOLN
25.0000 mg | Freq: Once | INTRAMUSCULAR | Status: AC
  Administered 2023-07-19: 25 mg via INTRAMUSCULAR
  Filled 2023-07-19: qty 1

## 2023-07-19 MED ORDER — PROCHLORPERAZINE EDISYLATE 10 MG/2ML IJ SOLN
10.0000 mg | Freq: Once | INTRAMUSCULAR | Status: AC
  Administered 2023-07-19: 10 mg via INTRAMUSCULAR
  Filled 2023-07-19: qty 2

## 2023-07-19 NOTE — ED Triage Notes (Addendum)
Pt reports nausea and migraine for 2 weeks.  Pain back of head

## 2023-07-19 NOTE — Discharge Instructions (Signed)
I am referring you to see the neurologist in the office.  This should give you a call to set up an appointment.  Hopefully the medicines I gave you today help reset your headache cycle.  You also had signs that you could have a sinus infection.  I am starting you on antibiotics for this.

## 2023-07-19 NOTE — ED Provider Notes (Signed)
[facility] Provider Note   CSN: [id number] Arrival date & time: 07/19/23  4098     History  Chief Complaint  Patient presents with   Migraine    Kelsey Ramsey is a 27 y.o. female.  27 yo  F with a chief complaint of a headache.  Patient has had these off and on.  She has been seen in urgent care for this previously.  She tells me at this time it has been going on for a couple of weeks.  She had struck her head but she is not sure that that caused the symptoms and she struck it on the other side that she is having pain.  Is having phonophobia and nausea.  Also has a feeling like her ears are full.  Denies cough or congestion denies fevers denies one-sided numbness or weakness denies difficulty speech or swallowing.  Denies neck pain.   Migraine       Home Medications Prior to Admission medications   Medication Sig Start Date End Date Taking? Authorizing Provider  amoxicillin-clavulanate (AUGMENTIN) 875-125 MG tablet Take 1 tablet by mouth every 12 (twelve) hours. 07/19/23  Yes Melene Plan, DO  escitalopram (LEXAPRO) 10 MG tablet Take 1 tablet (10 mg total) by mouth daily. 05/05/23   Mardella Layman, MD  fluconazole (DIFLUCAN) 150 MG tablet Take 1 tablet (150 mg total) by mouth as directed. Repeat in 3 days if needed 05/06/23   Freddy Finner, NP  losartan (COZAAR) 50 MG tablet Take 1 tablet (50 mg total) by mouth daily. 08/05/21 05/05/23  Theadora Rama Scales, PA-C  Prenat-FeFum-DSS-FA-DHA w/o A (PNV-DHA+DOCUSATE) 27-1.25-300 MG CAPS Take 1 capsule by mouth daily before breakfast. 02/02/23   Brock Bad, MD  medroxyPROGESTERone (DEPO-PROVERA) 150 MG/ML injection Inject 1 mL (150 mg total) into the muscle every 3 (three) months. Patient not taking: Reported on 09/26/2019 12/28/18 12/06/19  Hermina Staggers, MD      Allergies    Pineapple    Review of Systems   Review of Systems  Physical Exam Updated Vital Signs BP (!) 145/97    Pulse 66   Temp 98.6 F (37 C)   Resp 16   Wt 95.6 kg   LMP 07/06/2023   SpO2 99%   BMI 33.01 kg/m  Physical Exam Vitals and nursing note reviewed.  Constitutional:      General: She is not in acute distress.    Appearance: She is well-developed. She is not diaphoretic.  HENT:     Head: Normocephalic and atraumatic.     Comments: Right TM with the serous effusion no obvious bulging or distortion of landmarks.  She does have exquisite tenderness to percussion over the left frontal sinus Eyes:     Pupils: Pupils are equal, round, and reactive to light.  Cardiovascular:     Rate and Rhythm: Normal rate and regular rhythm.     Heart sounds: No murmur heard.    No friction rub. No gallop.  Pulmonary:     Effort: Pulmonary effort is normal.     Breath sounds: No wheezing or rales.  Abdominal:     General: There is no distension.     Palpations: Abdomen is soft.     Tenderness: There is no abdominal tenderness.  Musculoskeletal:        General: No tenderness.     Cervical back: Normal range of motion and neck supple.  Skin:    General: Skin  is warm and dry.  Neurological:     Mental Status: She is alert and oriented to person, place, and time.     Cranial Nerves: Cranial nerves 2-12 are intact.     Sensory: Sensation is intact.     Motor: Motor function is intact.     Coordination: Coordination is intact.     Gait: Gait is intact.     Comments: Patient has some difference in speed from finger-to-nose from the left side to the right but is able to accomplish without any wavering or past-pointing otherwise benign neurologic exam.  Psychiatric:        Behavior: Behavior normal.     ED Results / Procedures / Treatments   Labs (all labs ordered are listed, but only abnormal results are displayed) Labs Reviewed - No data to display  EKG None  Radiology No results found.  Procedures Procedures    Medications Ordered in ED Medications  prochlorperazine (COMPAZINE)  injection 10 mg (has no administration in time range)  diphenhydrAMINE (BENADRYL) injection 25 mg (has no administration in time range)  dexamethasone (DECADRON) tablet 10 mg (has no administration in time range)    ED Course/ Medical Decision Making/ A&P                                 Medical Decision Making Risk Prescription drug management.   27 yo F with a chief complaints of a headache.  This has been an off-and-on problem for her.  This time it has been going on for at least a couple weeks.  She feels like its occipital and then radiates to the left frontal region.  On my record review the patient has been seen at urgent care multiple times for similar symptoms.  She does have signs of congestion and has tenderness over the left frontal sinus.  I will treat her for possible sinusitis.  Will give a headache cocktail here.  With frequent recurrence will give referral to neurology.  8:00 AM:  I have discussed the diagnosis/risks/treatment options with the patient and family.  Evaluation and diagnostic testing in the emergency department does not suggest an emergent condition requiring admission or immediate intervention beyond what has been performed at this time.  They will follow up with PCP, Neuro. We also discussed returning to the ED immediately if new or worsening sx occur. We discussed the sx which are most concerning (e.g., sudden worsening pain, fever, inability to tolerate by mouth, stroke s/sx, sudden worsening pain) that necessitate immediate return. Medications administered to the patient during their visit and any new prescriptions provided to the patient are listed below.  Medications given during this visit Medications  prochlorperazine (COMPAZINE) injection 10 mg (has no administration in time range)  diphenhydrAMINE (BENADRYL) injection 25 mg (has no administration in time range)  dexamethasone (DECADRON) tablet 10 mg (has no administration in time range)     The  patient appears reasonably screen and/or stabilized for discharge and I doubt any other medical condition or other [facility] requiring further screening, evaluation, or treatment in the ED at this time prior to discharge.          Final Clinical Impression(s) / ED Diagnoses Final diagnoses:  Frontal headache    Rx / DC Orders ED Discharge Orders          Ordered    amoxicillin-clavulanate (AUGMENTIN) 875-125 MG tablet  Every 12 hours  07/19/23 0756    Ambulatory referral to Neurology       Comments: New headache syndrome?   07/19/23 0756              Melene Plan, DO 07/19/23 0800

## 2023-07-19 NOTE — ED Notes (Signed)
Reviewed discharge instructions and recommendations with pt. Questions addressed. States understanding. Requested urine pregnancy Pt will follow up on mychart

## 2023-08-21 ENCOUNTER — Ambulatory Visit (HOSPITAL_COMMUNITY): Payer: Commercial Managed Care - HMO

## 2023-08-22 ENCOUNTER — Ambulatory Visit (HOSPITAL_COMMUNITY)
Admission: RE | Admit: 2023-08-22 | Discharge: 2023-08-22 | Disposition: A | Discharge status: Home / Self Care | Payer: Medicaid Other | Source: Ambulatory Visit | Attending: Internal Medicine | Admitting: Internal Medicine

## 2023-08-22 ENCOUNTER — Encounter (HOSPITAL_COMMUNITY): Payer: Self-pay

## 2023-08-22 VITALS — BP 129/79 | HR 84 | Temp 98.4°F | Resp 16 | Ht 67.0 in | Wt 210.0 lb

## 2023-08-22 DIAGNOSIS — K5900 Constipation, unspecified: Secondary | ICD-10-CM | POA: Insufficient documentation

## 2023-08-22 DIAGNOSIS — R102 Pelvic and perineal pain: Secondary | ICD-10-CM | POA: Diagnosis present

## 2023-08-22 DIAGNOSIS — R3915 Urgency of urination: Secondary | ICD-10-CM | POA: Insufficient documentation

## 2023-08-22 DIAGNOSIS — R3911 Hesitancy of micturition: Secondary | ICD-10-CM | POA: Insufficient documentation

## 2023-08-22 LAB — POCT URINALYSIS DIP (MANUAL ENTRY)
Bilirubin, UA: NEGATIVE
Blood, UA: NEGATIVE
Glucose, UA: NEGATIVE mg/dL
Ketones, POC UA: NEGATIVE mg/dL
Nitrite, UA: NEGATIVE
Protein Ur, POC: 30 mg/dL — AB
Spec Grav, UA: 1.02 (ref 1.010–1.025)
Urobilinogen, UA: 0.2 U/dL
pH, UA: 6 (ref 5.0–8.0)

## 2023-08-22 LAB — POCT URINE PREGNANCY: Preg Test, Ur: NEGATIVE

## 2023-08-22 MED ORDER — LOSARTAN POTASSIUM 50 MG PO TABS: 50.0000 mg | ORAL_TABLET | Freq: Every day | ORAL | 0 refills | Status: DC

## 2023-08-22 NOTE — Discharge Instructions (Signed)
You were seen today for pelvic pain, urinary urgency, urinary hesitancy and irregular bowel movements.  Your urinalysis does not show any evidence of infection.  You are not pregnant.  We are sending off a swab to check for gonorrhea, chlamydia, trichomonas, BV or yeast, you will be called with any positive result and recommended treatment plan.  In the meantime, I would recommend ibuprofen 600 mg every 8 hours as needed for pelvic pain.  A heating pad may also be helpful.  If your bowels are moving irregularly, this can sometimes cause right lower quadrant abdominal pain.  You may want to consider taking MiraLAX daily as needed.  I have refilled your losartan to your pharmacy for you.

## 2023-08-22 NOTE — ED Provider Notes (Signed)
[facility]    CSN: [id number] Arrival date & time: 08/22/23  1426      History   Chief Complaint Chief Complaint  Patient presents with   Abdominal Pain    HPI Kelsey Ramsey is a 27 y.o. female with a history of HTN, nephrotic syndrome, CKD presents to urgent care today with complaint of right lower quadrant pain that started 2 weeks ago.  She reports the pain is intermittent.  She describes the pain is sore, achy, sharp and stabbing.  The pain does not radiate.  She reports associated urinary urgency and hesitancy but denies dysuria, burning with urination, blood in her urine, vaginal discharge, vaginal itching, vaginal irritation or odor.  Her LMP was 9/16.  She is sexually active and has been trying to conceive but has not gotten pregnant despite taking the prenatal vitamins.  She does have an OB/GYN.  She also reports some mild constipation but denies abdominal bloating, nausea, vomiting or blood in her stool.  She reports she has been taking AZO OTC with some relief of symptoms.  She reports she is also out of her losartan.  She reports she ran out 2 weeks ago.  Her BP today is 129/79.  She would like this refilled today.  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   Nephrotic syndrome, focal and segmental glomerular lesions 04/06/2016   Renal hypertension 04/06/2016   Subcutaneous mass 09/27/2015   Chronic kidney disease 08/10/2014   Chronic kidney disease (CKD) stage G3a/A2, moderately decreased glomerular filtration rate (GFR) between 45-59 mL/min/1.73 square meter and  albuminuria creatinine ratio between 30-299 mg/g (HCC) 08/10/2014   Therapeutic drug monitoring 05/12/2012   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 Authorizing Provider  escitalopram (LEXAPRO) 10 MG tablet Take 1 tablet (10 mg total) by mouth daily. 05/05/23   Mardella Layman, MD  losartan (COZAAR) 50 MG tablet Take 1 tablet (50 mg total) by mouth daily. 08/22/23 09/21/23  Lorre Munroe, NP  ondansetron (ZOFRAN-ODT) 4 MG disintegrating tablet 4mg  ODT q4 hours prn nausea/vomit 07/19/23   Melene Plan, DO  Prenat-FeFum-DSS-FA-DHA w/o A (PNV-DHA+DOCUSATE) 27-1.25-300 MG CAPS Take 1 capsule by mouth daily before breakfast. 02/02/23   Brock Bad, MD  medroxyPROGESTERone (DEPO-PROVERA) 150 MG/ML injection Inject 1 mL (150 mg total) into the muscle every 3 (three) months. Patient not taking: Reported on 09/26/2019 12/28/18 12/06/19  Hermina Staggers, MD    Family History  Family History  Problem Relation Age of Onset   Diabetes Paternal Grandfather    Stroke Maternal Grandmother    Cancer Maternal Grandfather    Diabetes Maternal Grandfather    Hypertension Mother    Heart disease Mother    Thyroid disease Mother    Kidney Stones Maternal Uncle     Social History Social History   Tobacco Use   Smoking status: Former    Types: Cigars    Passive exposure: Never   Smokeless tobacco: Never   Tobacco comments:    rare  Vaping Use   Vaping status: Former  Substance Use Topics   Alcohol use: Yes    Comment: socially   Drug use: No     Allergies   Pineapple   Review of Systems Review of Systems  Constitutional:  Negative for  chills and fever.  Respiratory:  Negative for shortness of breath.   Cardiovascular:  Negative for chest pain.  Gastrointestinal:  Positive for constipation. Negative for abdominal pain, blood in stool, diarrhea, nausea and vomiting.  Genitourinary:  Positive for decreased urine volume, pelvic pain and urgency. Negative for dysuria, flank pain, hematuria, menstrual problem, vaginal bleeding and vaginal discharge.  Musculoskeletal:  Negative for arthralgias and myalgias.  Neurological:  Negative for weakness and headaches.     Physical Exam Triage Vital Signs ED Triage Vitals  Encounter Vitals Group     BP 08/22/23 1450 129/79     Systolic BP Percentile --      Diastolic BP Percentile --      Pulse Rate 08/22/23 1450 84     Resp 08/22/23 1450 16     Temp 08/22/23 1450 98.4 F (36.9 C)     Temp Source 08/22/23 1450 Oral     SpO2 08/22/23 1450 98 %     Weight 08/22/23 1450 210 lb (95.3 kg)     Height 08/22/23 1450 5\' 7"  (1.702 m)     Head Circumference --      Peak Flow --      Pain Score 08/22/23 1449 10     Pain Loc --      Pain Education --      Exclude from Growth Chart --    No data found.  Updated Vital Signs BP 129/79 (BP Location: Right Arm)   Pulse 84   Temp 98.4 F (36.9 C) (Oral)   Resp 16   Ht 5\' 7"  (1.702 m)   Wt 210 lb (95.3 kg)   LMP 08/06/2023 (Approximate)   SpO2 98%   BMI 32.89 kg/m      Physical Exam Constitutional:      General: She is not in acute distress.    Appearance: She is obese.  Cardiovascular:     Rate and Rhythm: Normal rate and regular rhythm.     Heart sounds: Normal heart sounds.  Pulmonary:     Effort: Pulmonary effort is normal.     Breath sounds: Normal breath sounds.  Abdominal:     General: Abdomen is flat. Bowel sounds are decreased. There is no distension.     Palpations: Abdomen is soft. There is no mass.     Tenderness: There is abdominal tenderness in the right lower quadrant and suprapubic area. There is no  guarding or rebound. Negative signs include McBurney's sign.  Genitourinary:    Comments: Self swab Skin:    General: Skin is warm and dry.     Findings: No rash.  Neurological:  General: No focal deficit present.     Mental Status: She is alert and oriented to person, place, and time.      UC Treatments / Results  Labs  Labs Reviewed  POCT URINALYSIS DIP (MANUAL ENTRY) - Abnormal; Notable for the following components:      Result Value   Clarity, UA hazy (*)    Protein Ur, POC =30 (*)    Leukocytes, UA Trace (*)    All other components within normal limits  POCT URINE PREGNANCY  CERVICOVAGINAL ANCILLARY ONLY    EKG   Radiology No results found.  Procedures Procedures (including critical care time)  Medications Ordered in UC Medications - No data to display  Initial Impression / Assessment and Plan / UC Course  I have reviewed the triage vital signs and the nursing notes.  Pertinent labs & imaging results that were available during my care of the patient were reviewed by me and considered in my medical decision making (see chart for details).     27 year old female with 2-week history of right lower quadrant abdominal pain, urinary urgency, urinary hesitancy and mild constipation.  DDx include UTI, vaginitis, STD, ectopic pregnancy, constipation.  Urinalysis does not show any evidence of infection.  Urine pregnancy negative.  Aptima swab sent to check for gonorrhea, chlamydia, trichomonas, BV and yeast, advised she will be called only if there are any abnormal results with recommendation of treatment plan.  In the meantime, would recommend ibuprofen 600 mg every 8 hours for lower abdominal pain.  Heating pad may also be helpful.  She could be experiencing this pain due to constipation, recommend MiraLAX daily.  Losartan refilled today despite her being normotensive given her history of nephrotic syndrome, CKD and renal hypertension.  Advised her to follow-up with GYN  if symptoms persist or worsen.  Final Clinical Impressions(s) / UC Diagnoses   Final diagnoses:  Pelvic pain  Urinary urgency  Urinary hesitancy  Constipation, unspecified constipation type     Discharge Instructions      You were seen today for pelvic pain, urinary urgency, urinary hesitancy and irregular bowel movements.  Your urinalysis does not show any evidence of infection.  You are not pregnant.  We are sending off a swab to check for gonorrhea, chlamydia, trichomonas, BV or yeast, you will be called with any positive result and recommended treatment plan.  In the meantime, I would recommend ibuprofen 600 mg every 8 hours as needed for pelvic pain.  A heating pad may also be helpful.  If your bowels are moving irregularly, this can sometimes cause right lower quadrant abdominal pain.  You may want to consider taking MiraLAX daily as needed.  I have refilled your losartan to your pharmacy for you.     ED Prescriptions     Medication Sig Dispense Auth. Provider   losartan (COZAAR) 50 MG tablet Take 1 tablet (50 mg total) by mouth daily. 30 tablet Lorre Munroe, NP      PDMP not reviewed this encounter.   Lorre Munroe, NP 08/22/23 1539

## 2023-08-22 NOTE — ED Triage Notes (Signed)
Patient here today with c/o RLQ abd pain and frequent urination X 2 weeks. Patient has been taking AZO with some relief. She states that she gets yeast infections when she is on antibiotics.   She is also requesting a refill on her Losartan. She ran out 2 weeks ago and does not have a PCP. Patient states that she has been having some headache and has noticed that her hand has been swelling.

## 2023-08-23 ENCOUNTER — Telehealth: Payer: Self-pay | Admitting: Emergency Medicine

## 2023-08-23 LAB — CERVICOVAGINAL ANCILLARY ONLY
Bacterial Vaginitis (gardnerella): POSITIVE — AB
Candida Glabrata: NEGATIVE
Candida Vaginitis: NEGATIVE
Chlamydia: NEGATIVE
Comment: NEGATIVE
Comment: NEGATIVE
Comment: NEGATIVE
Comment: NEGATIVE
Comment: NEGATIVE
Comment: NORMAL
Neisseria Gonorrhea: NEGATIVE
Trichomonas: NEGATIVE

## 2023-08-23 MED ORDER — METRONIDAZOLE 500 MG PO TABS: 500.0000 mg | ORAL_TABLET | Freq: Two times a day (BID) | ORAL | 0 refills | Status: DC

## 2023-08-23 NOTE — Telephone Encounter (Signed)
Metronidazole for positive BV

## 2023-10-10 ENCOUNTER — Ambulatory Visit (INDEPENDENT_AMBULATORY_CARE_PROVIDER_SITE_OTHER): Payer: Medicaid Other

## 2023-10-10 ENCOUNTER — Ambulatory Visit (HOSPITAL_COMMUNITY)
Admission: EM | Admit: 2023-10-10 | Discharge: 2023-10-10 | Disposition: A | Discharge status: Home / Self Care | Payer: Medicaid Other | Attending: Family Medicine | Admitting: Family Medicine

## 2023-10-10 ENCOUNTER — Encounter (HOSPITAL_COMMUNITY): Payer: Self-pay

## 2023-10-10 DIAGNOSIS — F410 Panic disorder [episodic paroxysmal anxiety] without agoraphobia: Secondary | ICD-10-CM | POA: Diagnosis not present

## 2023-10-10 DIAGNOSIS — R072 Precordial pain: Secondary | ICD-10-CM

## 2023-10-10 DIAGNOSIS — I10 Essential (primary) hypertension: Secondary | ICD-10-CM | POA: Diagnosis not present

## 2023-10-10 MED ORDER — LOSARTAN POTASSIUM 25 MG PO TABS: 25.0000 mg | ORAL_TABLET | Freq: Every day | ORAL | 2 refills | Status: AC

## 2023-10-10 MED ORDER — ESCITALOPRAM OXALATE 10 MG PO TABS: 10.0000 mg | ORAL_TABLET | Freq: Every day | ORAL | 2 refills | Status: AC

## 2023-10-10 NOTE — ED Provider Notes (Signed)
[facility]    CSN: [id number] Arrival date & time: 10/10/23  1319      History   Chief Complaint Chief Complaint  Patient presents with   Chest Pain   Shortness of Breath    HPI Kelsey TSAI is a 27 y.o. female.    Chest Pain Associated symptoms: shortness of breath   Shortness of Breath Associated symptoms: chest pain   Here for intermittent chest pain has had some shortness of breath associated with it.  She has been getting these pains in the last month.  No fever or cough.  She has not felt like this is her asthma bothering her.  No tachycardia or palpitations and no diaphoresis.  No nausea or vomiting.  She has not been taking her losartan 50 mg a day for the last month and she has not been taking her Lexapro 10 mg for the last month.  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   Nephrotic syndrome, focal and segmental glomerular lesions 04/06/2016   Renal hypertension 04/06/2016   Subcutaneous mass 09/27/2015   Chronic kidney disease 08/10/2014   Chronic kidney disease (CKD) stage G3a/A2, moderately decreased glomerular filtration rate (GFR) between 45-59 mL/min/1.73 square meter and albuminuria creatinine ratio between 30-299 mg/g (HCC) 08/10/2014   Therapeutic drug monitoring 05/12/2012   History of TB (tuberculosis) 02/11/2012    Past Surgical History:  Procedure Laterality Date   RENAL BIOPSY      OB History     Gravida  0   Para  0   Term  0   Preterm  0   AB  0   Living  0      SAB  0   IAB  0    Ectopic  0   Multiple  0   Live Births  0            Home Medications    Prior to Admission medications   Medication Sig Start Date End Date Taking? Authorizing Provider  losartan (COZAAR) 25 MG tablet Take 1 tablet (25 mg total) by mouth daily. 10/10/23  Yes Zenia Resides, MD  escitalopram (LEXAPRO) 10 MG tablet Take 1 tablet (10 mg total) by mouth daily. 10/10/23   Zenia Resides, MD  Prenat-FeFum-DSS-FA-DHA w/o A (PNV-DHA+DOCUSATE) 27-1.25-300 MG CAPS Take 1 capsule by mouth daily before breakfast. 02/02/23   Brock Bad, MD  medroxyPROGESTERone (DEPO-PROVERA) 150 MG/ML injection Inject 1 mL (150 mg total) into the muscle every 3 (three) months. Patient not taking: Reported on 09/26/2019 12/28/18 12/06/19  Hermina Staggers, MD    Family History Family History  Problem Relation Age of Onset   Diabetes Paternal Grandfather    Stroke Maternal Grandmother    Cancer Maternal Grandfather    Diabetes Maternal Grandfather    Hypertension Mother    Heart disease Mother    Thyroid disease Mother    Kidney Stones Maternal Uncle     Social History Social History   Tobacco Use   Smoking status:  Former    Types: Gaffer exposure: Never   Smokeless tobacco: Never   Tobacco comments:    rare  Vaping Use   Vaping status: Some Days   Substances: Flavoring  Substance Use Topics   Alcohol use: Yes    Comment: socially   Drug use: No     Allergies   Pineapple   Review of Systems Review of Systems  Respiratory:  Positive for shortness of breath.   Cardiovascular:  Positive for chest pain.     Physical Exam Triage Vital Signs ED Triage Vitals  Encounter Vitals Group     BP 10/10/23 1506 132/86     Systolic BP Percentile --      Diastolic BP Percentile --      Pulse Rate 10/10/23 1506 77     Resp 10/10/23 1506 16     Temp 10/10/23 1506 98.4 F (36.9 C)     Temp Source 10/10/23 1506 Oral     SpO2 10/10/23 1506 96 %     Weight --       Height --      Head Circumference --      Peak Flow --      Pain Score 10/10/23 1505 10     Pain Loc --      Pain Education --      Exclude from Growth Chart --    No data found.  Updated Vital Signs BP 132/86 (BP Location: Left Arm)   Pulse 77   Temp 98.4 F (36.9 C) (Oral)   Resp 16   LMP 09/27/2023   SpO2 96%   Visual Acuity Right Eye Distance:   Left Eye Distance:   Bilateral Distance:    Right Eye Near:   Left Eye Near:    Bilateral Near:     Physical Exam Vitals reviewed.  Constitutional:      General: She is not in acute distress.    Appearance: She is not ill-appearing, toxic-appearing or diaphoretic.  HENT:     Nose: Nose normal.     Mouth/Throat:     Mouth: Mucous membranes are moist.     Pharynx: No oropharyngeal exudate or posterior oropharyngeal erythema.  Eyes:     Extraocular Movements: Extraocular movements intact.     Conjunctiva/sclera: Conjunctivae normal.     Pupils: Pupils are equal, round, and reactive to light.  Cardiovascular:     Rate and Rhythm: Normal rate and regular rhythm.     Heart sounds: No murmur heard. Pulmonary:     Effort: Pulmonary effort is normal. No respiratory distress.     Breath sounds: Normal breath sounds. No stridor. No wheezing, rhonchi or rales.  Chest:     Chest wall: No tenderness.  Musculoskeletal:     Cervical back: Neck supple.  Lymphadenopathy:     Cervical: No cervical adenopathy.  Skin:    Coloration: Skin is not pale.  Neurological:     General: No focal deficit present.     Mental Status: She is alert and oriented to person, place, and time.  Psychiatric:        Behavior: Behavior normal.      UC Treatments / Results  Labs (all labs ordered are listed, but only abnormal results are displayed) Labs Reviewed - No data to display  EKG   Radiology DG Chest 2 View  Result Date: 10/10/2023 CLINICAL DATA:  Intermittent atypical chest pain for 1 month, right lateral pain radiating  to right  upper quadrant EXAM: CHEST - 2 VIEW COMPARISON:  05/06/2022 FINDINGS: The heart size and mediastinal contours are within normal limits. Both lungs are clear. The visualized skeletal structures are unremarkable. IMPRESSION: No active cardiopulmonary disease. Electronically Signed   By: Sharlet Salina M.D.   On: 10/10/2023 15:53    Procedures Procedures (including critical care time)  Medications Ordered in UC Medications - No data to display  Initial Impression / Assessment and Plan / UC Course  I have reviewed the triage vital signs and the nursing notes.  Pertinent labs & imaging results that were available during my care of the patient were reviewed by me and considered in my medical decision making (see chart for details).      Chest x-ray is negative and EKG is normal without any ST segment changes  I do wonder if some of her symptoms are due to her being off her Lexapro.  Lexapro prescription is sent in and I have sent in a prescription for losartan, but at a lower dose since her blood pressure is still really acceptable today.  Staff will help her set up a primary care appointment at discharge   Final Clinical Impressions(s) / UC Diagnoses   Final diagnoses:  Precordial pain  Essential hypertension  Panic disorder     Discharge Instructions      Your EKG was normal  Your chest x-ray was normal also  Restart taking the Lexapro 10 mg--1 daily  Start taking losartan 25 mg--1 daily for blood pressure; your prior dose was 50 mg  You can use the QR code/website at the back of the summary paperwork to schedule yourself a new patient appointment with primary care      ED Prescriptions     Medication Sig Dispense Auth. Provider   escitalopram (LEXAPRO) 10 MG tablet Take 1 tablet (10 mg total) by mouth daily. 30 tablet Zenia Resides, MD   losartan (COZAAR) 25 MG tablet Take 1 tablet (25 mg total) by mouth daily. 30 tablet Bexley Laubach, Janace Aris, MD      PDMP  not reviewed this encounter.   Zenia Resides, MD 10/10/23 1600

## 2023-10-10 NOTE — ED Triage Notes (Addendum)
Patient reports that she was at work yesterday and began having pain in the right lateral chest that radiated into the right upper abdomen. Patient then states, "It made me SOB and felt like my heart was shocked." Patient states she has had several episodes yesterday and today. Patient states , "I do have panic attacks, but this felt worse. I am having a lot go on at work."  Patient also reports that she has not been taking her BP meds because she ran out a month ago. I need a primary doctor." Patient also says she can not find her Lexapro and has not been taking.

## 2023-10-10 NOTE — Discharge Instructions (Signed)
Your EKG was normal  Your chest x-ray was normal also  Restart taking the Lexapro 10 mg--1 daily  Start taking losartan 25 mg--1 daily for blood pressure; your prior dose was 50 mg  You can use the QR code/website at the back of the summary paperwork to schedule yourself a new patient appointment with primary care

## 2023-10-13 ENCOUNTER — Encounter (HOSPITAL_COMMUNITY): Payer: Self-pay

## 2023-10-13 ENCOUNTER — Ambulatory Visit (HOSPITAL_COMMUNITY)
Admission: RE | Admit: 2023-10-13 | Discharge: 2023-10-13 | Disposition: A | Discharge status: Home / Self Care | Payer: Medicaid Other | Source: Ambulatory Visit | Attending: Emergency Medicine | Admitting: Emergency Medicine

## 2023-10-13 ENCOUNTER — Other Ambulatory Visit: Payer: Self-pay

## 2023-10-13 VITALS — BP 131/84 | HR 82 | Temp 98.6°F | Resp 20

## 2023-10-13 DIAGNOSIS — Z202 Contact with and (suspected) exposure to infections with a predominantly sexual mode of transmission: Secondary | ICD-10-CM | POA: Insufficient documentation

## 2023-10-13 DIAGNOSIS — Z113 Encounter for screening for infections with a predominantly sexual mode of transmission: Secondary | ICD-10-CM | POA: Diagnosis present

## 2023-10-13 LAB — HIV ANTIBODY (ROUTINE TESTING W REFLEX): HIV Screen 4th Generation wRfx: NONREACTIVE

## 2023-10-13 LAB — RPR: RPR Ser Ql: NONREACTIVE

## 2023-10-13 NOTE — ED Triage Notes (Signed)
Most recent relationship cheated on her.  Patient left this partner.  That partner reports having trich chlamydia  Patient denies any symptoms.

## 2023-10-13 NOTE — ED Provider Notes (Signed)
[facility]    CSN: [id number] Arrival date & time: 10/13/23  0831      History   Chief Complaint Chief Complaint  Patient presents with   Exposure to STD    Need to do a vaginal swab - Entered by patient    HPI Kelsey Ramsey is a 27 y.o. female.  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   Nephrotic syndrome, focal and segmental glomerular lesions 04/06/2016   Renal hypertension 04/06/2016   Subcutaneous mass 09/27/2015   Chronic kidney disease 08/10/2014   Chronic kidney disease (CKD) stage G3a/A2, moderately decreased glomerular filtration rate (GFR) between 45-59 mL/min/1.73 square meter and albuminuria creatinine ratio between 30-299 mg/g (HCC) 08/10/2014   Therapeutic drug monitoring 05/12/2012   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 Authorizing Provider  escitalopram (LEXAPRO) 10 MG tablet Take 1 tablet (10 mg total) by mouth daily. 10/10/23   Zenia Resides, MD  losartan (COZAAR) 25 MG tablet Take 1 tablet (25 mg total)  by mouth daily. 10/10/23   Zenia Resides, MD  Prenat-FeFum-DSS-FA-DHA w/o A (PNV-DHA+DOCUSATE) 27-1.25-300 MG CAPS Take 1 capsule by mouth daily before breakfast. 02/02/23   Brock Bad, MD  medroxyPROGESTERone (DEPO-PROVERA) 150 MG/ML injection Inject 1 mL (150 mg total) into the muscle every 3 (three) months. Patient not taking: Reported on 09/26/2019 12/28/18 12/06/19  Hermina Staggers, MD    Family History Family History  Problem Relation Age of Onset   Diabetes Paternal Grandfather    Stroke Maternal Grandmother    Cancer Maternal Grandfather    Diabetes Maternal Grandfather    Hypertension Mother    Heart disease Mother    Thyroid disease Mother    Kidney Stones Maternal Uncle     Social History Social History   Tobacco Use   Smoking status: Former    Types: Cigars    Passive exposure: Never   Smokeless tobacco: Never   Tobacco comments:    rare  Vaping Use   Vaping status: Some Days   Substances: Flavoring  Substance Use Topics   Alcohol use: Yes    Comment: socially   Drug use: No     Allergies   Pineapple   Review of Systems Review of Systems  Physical Exam Triage Vital Signs ED Triage Vitals  Encounter Vitals Group     BP      Systolic BP Percentile      Diastolic BP Percentile      Pulse      Resp      Temp      Temp src      SpO2      Weight      Height      Head Circumference      Peak Flow      Pain Score      Pain Loc      Pain Education      Exclude from Growth Chart    No data found.  Updated Vital Signs BP 131/84 (BP Location: Right Arm)   Pulse 82   Temp 98.6 F (37 C) (Oral)   Resp 20   LMP 09/27/2023   SpO2 95%   Visual Acuity Right Eye Distance:   Left Eye Distance:   Bilateral Distance:    Right Eye Near:   Left Eye Near:    Bilateral Near:     Physical Exam Vitals and nursing note reviewed.  Constitutional:      General: She is not in acute distress.    Appearance: Normal appearance.   Cardiovascular:     Rate and Rhythm: Normal rate and regular rhythm.     Heart sounds: Normal heart sounds.  Pulmonary:     Effort: Pulmonary effort is normal.     Breath sounds: Normal breath sounds.  Neurological:     Mental Status: She is alert and oriented to person, place, and time.      UC Treatments / Results  Labs (all labs ordered are listed, but only abnormal results are displayed) Labs Reviewed  RPR  HIV ANTIBODY (ROUTINE TESTING W REFLEX)  CERVICOVAGINAL ANCILLARY ONLY    EKG   Radiology No results found.  Procedures Procedures (including critical care time)  Medications Ordered in UC Medications - No data to display  Initial Impression / Assessment and Plan / UC Course  I have reviewed the triage vital signs and the nursing notes.  Pertinent labs & imaging results that were available during my care of the patient were reviewed by me and considered in my medical decision making (see chart for details).  Cytology swab is pending with HIV/RPR Treat positive result as indicated  Safe sex precautions  Final Clinical Impressions(s) / UC Diagnoses   Final diagnoses:  Possible exposure to STD  Screen for STD (sexually transmitted disease)     Discharge Instructions      We will call you if anything on your swab or blood work returns positive. You can also see these results on MyChart. Please abstain from sexual intercourse until your results return     ED Prescriptions   None    PDMP not reviewed this encounter.   Kathrine Haddock 10/13/23 8295

## 2023-10-13 NOTE — Discharge Instructions (Signed)
We will call you if anything on your swab or blood work returns positive. You can also see these results on MyChart. Please abstain from sexual intercourse until your results return.

## 2023-10-14 LAB — CERVICOVAGINAL ANCILLARY ONLY
Chlamydia: NEGATIVE
Comment: NEGATIVE
Comment: NEGATIVE
Comment: NORMAL
Neisseria Gonorrhea: NEGATIVE
Trichomonas: NEGATIVE

## 2023-10-25 ENCOUNTER — Ambulatory Visit: Payer: Commercial Managed Care - HMO | Admitting: Neurology

## 2024-01-04 ENCOUNTER — Ambulatory Visit (HOSPITAL_COMMUNITY)
Admission: RE | Admit: 2024-01-04 | Discharge: 2024-01-04 | Disposition: A | Discharge status: Home / Self Care | Payer: Medicaid Other | Source: Ambulatory Visit | Attending: Family Medicine | Admitting: Family Medicine

## 2024-01-04 ENCOUNTER — Encounter (HOSPITAL_COMMUNITY): Payer: Self-pay

## 2024-01-04 VITALS — BP 134/85 | HR 65 | Temp 98.3°F | Resp 16

## 2024-01-04 DIAGNOSIS — H6991 Unspecified Eustachian tube disorder, right ear: Secondary | ICD-10-CM | POA: Diagnosis not present

## 2024-01-04 DIAGNOSIS — R110 Nausea: Secondary | ICD-10-CM

## 2024-01-04 LAB — POCT URINE PREGNANCY: Preg Test, Ur: NEGATIVE

## 2024-01-04 MED ORDER — PREDNISONE 20 MG PO TABS: 40.0000 mg | ORAL_TABLET | Freq: Every day | ORAL | 0 refills | Status: AC

## 2024-01-04 NOTE — ED Triage Notes (Signed)
Pt states she is having right ear pain. She Googled and read when you have ear pain it could be pink eye too.   She is having some nausea mostly in the morning she would like a pregnancy test since people are having dreams she is pregnant. She states her cycle has been "weird" She had two last month then a light one this month.

## 2024-01-04 NOTE — Discharge Instructions (Signed)
You were seen today for ear pain and nausea.  Your pregnancy test was negative today.   Your ear does not show any acute infection, but rather fluid behind the ear.  I have sent out prednisone daily x 5 days to see if helpful.  You may also use over the counter clartitin/zyrtec.  If you are not improving then please return for re-evaluation.

## 2024-01-04 NOTE — ED Provider Notes (Signed)
[facility]    CSN: [id number] Arrival date & time: 01/04/24  1121      History   Chief Complaint Chief Complaint  Patient presents with   Otalgia   Possible Pregnancy    HPI Kelsey Ramsey is a 28 y.o. female.    Otalgia Associated symptoms: headaches   Possible Pregnancy Associated symptoms include headaches.  Patient is here for right ear pain. This started 1 week ago.  Her ear has been "running". No sinus congestion or drainage.   She has been having some morning nausea for about 1 week as well.    Her LMP was 1/31, but was late and only lasted x 3 days.  No vomiting. She would like a pregnancy test.   Of noted, she answered positive to the PHQ-9.  However she states she is back on her meds and feeling better.       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 07/02/2016   Nephrotic syndrome, focal and segmental glomerular lesions 04/06/2016   Renal hypertension 04/06/2016   Subcutaneous mass 09/27/2015   Chronic kidney disease 08/10/2014   Chronic kidney disease (CKD) stage G3a/A2, moderately decreased glomerular filtration rate (GFR) between 45-59 mL/min/1.73 square meter and albuminuria creatinine ratio between 30-299 mg/g (HCC) 08/10/2014   Therapeutic drug monitoring 05/12/2012   History of TB (tuberculosis) 02/11/2012    Past Surgical History:  Procedure Laterality Date   RENAL BIOPSY      OB History     Gravida  0   Para  0   Term  0   Preterm  0   AB  0   Living  0      SAB  0   IAB  0   Ectopic  0   Multiple  0   Live Births  0             Home Medications    Prior to Admission medications   Medication Sig Start Date End Date Taking? Authorizing Provider  escitalopram (LEXAPRO) 10 MG tablet Take 1 tablet (10 mg total) by mouth daily. 10/10/23  Yes Zenia Resides, MD  losartan (COZAAR) 25 MG tablet Take 1 tablet (25 mg total) by mouth daily. 10/10/23  Yes Zenia Resides, MD  Prenat-FeFum-DSS-FA-DHA w/o A (PNV-DHA+DOCUSATE) 27-1.25-300 MG CAPS Take 1 capsule by mouth daily before breakfast. 02/02/23  Yes Brock Bad, MD  medroxyPROGESTERone (DEPO-PROVERA) 150 MG/ML injection Inject 1 mL (150 mg total) into the muscle every 3 (three) months. Patient not taking: Reported on 09/26/2019 12/28/18 12/06/19  Hermina Staggers, MD    Family History Family History  Problem Relation Age of Onset   Diabetes Paternal Grandfather    Stroke Maternal Grandmother    Cancer Maternal Grandfather    Diabetes Maternal Grandfather    Hypertension Mother    Heart disease Mother    Thyroid disease Mother    Kidney Stones Maternal Uncle     Social History Social History   Tobacco Use   Smoking status: Former    Types: Gaffer  exposure: Never   Smokeless tobacco: Never   Tobacco comments:    rare  Vaping Use   Vaping status: Some Days   Substances: Flavoring  Substance Use Topics   Alcohol use: Yes    Comment: socially   Drug use: No     Allergies   Pineapple   Review of Systems Review of Systems  Constitutional: Negative.   HENT:  Positive for ear pain.   Respiratory: Negative.    Cardiovascular: Negative.   Gastrointestinal:  Positive for nausea.  Neurological:  Positive for headaches.     Physical Exam Triage Vital Signs ED Triage Vitals  Encounter Vitals Group     BP 01/04/24 1149 134/85     Systolic BP Percentile --      Diastolic BP Percentile --      Pulse Rate 01/04/24 1149 65     Resp 01/04/24 1149 16     Temp 01/04/24 1149 98.3 F (36.8 C)     Temp Source 01/04/24 1149 Oral      SpO2 01/04/24 1149 98 %     Weight --      Height --      Head Circumference --      Peak Flow --      Pain Score 01/04/24 1146 10     Pain Loc --      Pain Education --      Exclude from Growth Chart --    No data found.  Updated Vital Signs BP 134/85 (BP Location: Right Arm)   Pulse 65   Temp 98.3 F (36.8 C) (Oral)   Resp 16   LMP 12/24/2023   SpO2 98%   Visual Acuity Right Eye Distance:   Left Eye Distance:   Bilateral Distance:    Right Eye Near:   Left Eye Near:    Bilateral Near:     Physical Exam Constitutional:      General: She is not in acute distress.    Appearance: Normal appearance. She is normal weight. She is ill-appearing. She is not toxic-appearing.  HENT:     Head: Normocephalic.     Right Ear: A middle ear effusion is present.     Left Ear: Tympanic membrane normal.     Nose: Nose normal.     Mouth/Throat:     Mouth: Mucous membranes are moist.  Cardiovascular:     Rate and Rhythm: Normal rate and regular rhythm.  Pulmonary:     Breath sounds: Normal breath sounds.  Musculoskeletal:     Cervical back: Normal range of motion and neck supple.  Neurological:     General: No focal deficit present.     Mental Status: She is alert.  Psychiatric:        Mood and Affect: Mood normal.      UC Treatments / Results  Labs (all labs ordered are listed, but only abnormal results are displayed) Labs Reviewed  POCT URINE PREGNANCY   UPT negative  EKG   Radiology No results found.  Procedures Procedures (including critical care time)  Medications Ordered in UC Medications - No data to display  Initial Impression / Assessment and Plan / UC Course  I have reviewed the triage vital signs and the nursing notes.  Pertinent labs & imaging results that were available during my care of the patient were reviewed by me and considered in my medical decision making (see chart for details).   Final Clinical Impressions(s) / UC Diagnoses  Final diagnoses:  Dysfunction of right eustachian tube  Nausea without vomiting     Discharge Instructions      You were seen today for ear pain and nausea.  Your pregnancy test was negative today.   Your ear does not show any acute infection, but rather fluid behind the ear.  I have sent out prednisone daily x 5 days to see if helpful.  You may also use over the counter clartitin/zyrtec.  If you are not improving then please return for re-evaluation.      ED Prescriptions     Medication Sig Dispense Auth. Provider   predniSONE (DELTASONE) 20 MG tablet Take 2 tablets (40 mg total) by mouth daily for 5 days. 10 tablet Jannifer Franklin, MD      PDMP not reviewed this encounter.   Jannifer Franklin, MD 01/04/24 1214

## 2024-02-21 ENCOUNTER — Other Ambulatory Visit: Payer: Self-pay

## 2024-02-21 ENCOUNTER — Encounter: Payer: Self-pay | Admitting: Emergency Medicine

## 2024-02-21 ENCOUNTER — Ambulatory Visit
Admission: EM | Admit: 2024-02-21 | Discharge: 2024-02-21 | Disposition: A | Discharge status: Home / Self Care | Attending: Family Medicine | Admitting: Family Medicine

## 2024-02-21 DIAGNOSIS — G43809 Other migraine, not intractable, without status migrainosus: Secondary | ICD-10-CM

## 2024-02-21 DIAGNOSIS — R197 Diarrhea, unspecified: Secondary | ICD-10-CM | POA: Diagnosis not present

## 2024-02-21 DIAGNOSIS — R112 Nausea with vomiting, unspecified: Secondary | ICD-10-CM | POA: Diagnosis not present

## 2024-02-21 MED ORDER — ONDANSETRON 4 MG PO TBDP: 4.0000 mg | ORAL_TABLET | Freq: Three times a day (TID) | ORAL | 0 refills | Status: AC | PRN

## 2024-02-21 MED ORDER — ONDANSETRON 4 MG PO TBDP
4.0000 mg | ORAL_TABLET | Freq: Once | ORAL | Status: AC
  Administered 2024-02-21: 4 mg via ORAL

## 2024-02-21 MED ORDER — KETOROLAC TROMETHAMINE 30 MG/ML IJ SOLN
30.0000 mg | Freq: Once | INTRAMUSCULAR | Status: AC
  Administered 2024-02-21: 30 mg via INTRAMUSCULAR

## 2024-02-21 NOTE — ED Triage Notes (Addendum)
 Pt reports nausea, emesis, diarrhea, intermittent fever, intermittent headache since Thursday. Pt reports was exposed to "stomach bug" last week. Reports has not been able to keep anything down and has not been able to take daily medications.   Pt reports was supposed to start new job today so will also need work note.

## 2024-02-22 NOTE — ED Provider Notes (Signed)
 [facility]    CSN: [id number] Arrival date & time: 02/21/24  0810      History   Chief Complaint Chief Complaint  Patient presents with   Nausea    HPI Kelsey Ramsey is a 28 y.o. female.   Patient presenting today with several day history of nausea, vomiting, diarrhea, intermittent fevers, headache.  Denies hematemesis, melena, severe abdominal pain, upper respiratory symptoms, new foods or medications.  So far has not been trying anything over-the-counter for symptoms.  Multiple sick contacts with stomach bug.  Requesting a work note for a new job as well.    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 07/02/2016   Nephrotic syndrome, focal and segmental glomerular lesions 04/06/2016   Renal hypertension 04/06/2016   Subcutaneous mass 09/27/2015   Chronic kidney disease 08/10/2014   Chronic kidney disease (CKD) stage G3a/A2, moderately decreased glomerular filtration rate (GFR) between 45-59 mL/min/1.73 square meter and albuminuria creatinine ratio between 30-299 mg/g (HCC) 08/10/2014   Therapeutic drug monitoring 05/12/2012   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 Authorizing Provider  ondansetron (ZOFRAN-ODT) 4 MG disintegrating  tablet Take 1 tablet (4 mg total) by mouth every 8 (eight) hours as needed for nausea or vomiting. 02/21/24  Yes Particia Nearing, PA-C  escitalopram (LEXAPRO) 10 MG tablet Take 1 tablet (10 mg total) by mouth daily. 10/10/23   Zenia Resides, MD  losartan (COZAAR) 25 MG tablet Take 1 tablet (25 mg total) by mouth daily. 10/10/23   Zenia Resides, MD  Prenat-FeFum-DSS-FA-DHA w/o A (PNV-DHA+DOCUSATE) 27-1.25-300 MG CAPS Take 1 capsule by mouth daily before breakfast. 02/02/23   Brock Bad, MD  medroxyPROGESTERone (DEPO-PROVERA) 150 MG/ML injection Inject 1 mL (150 mg total) into the muscle every 3 (three) months. Patient not taking: Reported on 09/26/2019 12/28/18 12/06/19  Hermina Staggers, MD    Family History Family History  Problem Relation Age of Onset   Diabetes Paternal Grandfather    Stroke Maternal Grandmother    Cancer Maternal Grandfather    Diabetes Maternal Grandfather    Hypertension Mother    Heart disease Mother    Thyroid disease Mother    Kidney Stones Maternal Uncle     Social History Social History   Tobacco Use   Smoking status: Former    Types: Cigars    Passive exposure: Never   Smokeless tobacco: Never   Tobacco comments:    rare  Vaping Use  Vaping status: Some Days   Substances: Flavoring  Substance Use Topics   Alcohol use: Yes    Comment: socially   Drug use: No     Allergies   Pineapple   Review of Systems Review of Systems PER HPI  Physical Exam Triage Vital Signs ED Triage Vitals  Encounter Vitals Group     BP 02/21/24 0833 126/86     Systolic BP Percentile --      Diastolic BP Percentile --      Pulse Rate 02/21/24 0833 74     Resp 02/21/24 0833 18     Temp 02/21/24 0833 99 F (37.2 C)     Temp Source 02/21/24 0833 Oral     SpO2 02/21/24 0833 95 %     Weight --      Height --      Head Circumference --      Peak Flow --      Pain Score 02/21/24 0831 10     Pain Loc --      Pain Education --       Exclude from Growth Chart --    No data found.  Updated Vital Signs BP 126/86 (BP Location: Right Arm)   Pulse 74   Temp 99 F (37.2 C) (Oral)   Resp 18   LMP 02/18/2024 (Approximate)   SpO2 95%   Visual Acuity Right Eye Distance:   Left Eye Distance:   Bilateral Distance:    Right Eye Near:   Left Eye Near:    Bilateral Near:     Physical Exam Vitals and nursing note reviewed.  Constitutional:      Appearance: Normal appearance. She is not ill-appearing.  HENT:     Head: Atraumatic.     Mouth/Throat:     Mouth: Mucous membranes are moist.     Pharynx: Oropharynx is clear.  Eyes:     Extraocular Movements: Extraocular movements intact.     Conjunctiva/sclera: Conjunctivae normal.  Cardiovascular:     Rate and Rhythm: Normal rate and regular rhythm.     Heart sounds: Normal heart sounds.  Pulmonary:     Effort: Pulmonary effort is normal.     Breath sounds: Normal breath sounds.  Abdominal:     General: Bowel sounds are normal. There is no distension.     Palpations: Abdomen is soft.     Tenderness: There is no abdominal tenderness. There is no guarding.  Musculoskeletal:        General: Normal range of motion.     Cervical back: Normal range of motion and neck supple.  Skin:    General: Skin is warm and dry.  Neurological:     Mental Status: She is alert and oriented to person, place, and time.     Motor: No weakness.     Gait: Gait normal.  Psychiatric:        Mood and Affect: Mood normal.        Thought Content: Thought content normal.        Judgment: Judgment normal.      UC Treatments / Results  Labs (all labs ordered are listed, but only abnormal results are displayed) Labs Reviewed - No data to display  EKG   Radiology No results found.  Procedures Procedures (including critical care time)  Medications Ordered in UC Medications  ketorolac (TORADOL) 30 MG/ML injection 30 mg (30 mg Intramuscular Given 02/21/24 0930)  ondansetron  (ZOFRAN-ODT) disintegrating tablet 4 mg (4  mg Oral Given 02/21/24 0930)    Initial Impression / Assessment and Plan / UC Course  I have reviewed the triage vital signs and the nursing notes.  Pertinent labs & imaging results that were available during my care of the patient were reviewed by me and considered in my medical decision making (see chart for details).     Vitals and exam reassuring today, no red flag findings.  Suspect viral GI illness and migraine.  She does note a history of migraines that have felt similar and have improved with IM medications.  Will treat this with IM Toradol and give Zofran both here and a prescription for as needed use going forward.  Discussed supportive home care and return precautions.  Work note given.  Final Clinical Impressions(s) / UC Diagnoses   Final diagnoses:  Nausea vomiting and diarrhea  Other migraine without status migrainosus, not intractable   Discharge Instructions   None    ED Prescriptions     Medication Sig Dispense Auth. Provider   ondansetron (ZOFRAN-ODT) 4 MG disintegrating tablet Take 1 tablet (4 mg total) by mouth every 8 (eight) hours as needed for nausea or vomiting. 20 tablet Particia Nearing, [place]      PDMP not reviewed this encounter.   Particia Nearing, [place] 02/22/24 1756

## 2024-04-14 ENCOUNTER — Ambulatory Visit: Admitting: Obstetrics and Gynecology

## 2024-05-10 ENCOUNTER — Encounter (INDEPENDENT_AMBULATORY_CARE_PROVIDER_SITE_OTHER): Payer: Self-pay

## 2024-07-26 ENCOUNTER — Ambulatory Visit: Admitting: Obstetrics & Gynecology

## 2024-08-07 ENCOUNTER — Institutional Professional Consult (permissible substitution) (INDEPENDENT_AMBULATORY_CARE_PROVIDER_SITE_OTHER): Admitting: Otolaryngology

## 2024-08-07 ENCOUNTER — Ambulatory Visit (INDEPENDENT_AMBULATORY_CARE_PROVIDER_SITE_OTHER): Admitting: Audiology

## 2024-08-08 ENCOUNTER — Ambulatory Visit: Admitting: Obstetrics

## 2024-08-21 ENCOUNTER — Ambulatory Visit: Admitting: Obstetrics

## 2024-08-31 ENCOUNTER — Ambulatory Visit: Admitting: Obstetrics & Gynecology

## 2024-09-19 ENCOUNTER — Ambulatory Visit: Admitting: Obstetrics and Gynecology

## 2024-09-28 ENCOUNTER — Ambulatory Visit: Admitting: Obstetrics and Gynecology

## 2024-09-28 ENCOUNTER — Encounter: Payer: Self-pay | Admitting: Obstetrics and Gynecology

## 2024-09-28 VITALS — BP 122/83 | HR 75 | Ht 67.0 in | Wt 224.0 lb

## 2024-09-28 DIAGNOSIS — Z3169 Encounter for other general counseling and advice on procreation: Secondary | ICD-10-CM

## 2024-09-28 NOTE — Progress Notes (Signed)
 28  y.o. GYN presents for Fertility Consult.  Pt has been trying to conceive for 1+ year.   LMP 09/16/24.

## 2024-09-28 NOTE — Progress Notes (Unsigned)
  CC: fertility counseling Subjective:    Patient ID: Kelsey Ramsey, female    DOB: May 29, 1996, 28 y.o.   MRN: [id number]  HPI 28 yo G0 seen for fertility counseling.  Pt has been trying for a year with her partner with no success.  Only intervention has been taking prenatal vitamins.  Pt notes regular menses every month.  Pt has significant medical history with nephrotic syndrome and hypertension.  She also has had gonorrhea and chlamydia 3-4 times in the past, all treated.  Pt advised that due to her HTN and nephrotic syndrome she would be at increased high risk with pregnancy.  Partner has not had semen analyzed.   Review of Systems     Objective:   Physical Exam Vitals:   09/28/24 1525  BP: 122/83  Pulse: 75         Assessment & Plan:   1. Encounter for preconception consultation (Primary) Discussed with patient she should be fully aware of how high risk her pregnancy would be due to preexisting medical conditions.  Will refer to MFM for counseling.  Partner may need semen analysis for complete fertility workup.  Due to multiple infections with STI, would recommend HSG to evaluate the tubes as well.  HSG protocol given to patient.   - AMB referral to maternal fetal medicine - Ambulatory Referral For Surgery Scheduling  I spent 30 minutes dedicated to the care of this patient including previsit review of records, face to face time with the patient discussing fertility workup and post visit testing.   Jerilynn DELENA Buddle, MD Faculty Attending, Center for [facility]

## 2024-09-29 ENCOUNTER — Encounter: Payer: Self-pay | Admitting: Obstetrics and Gynecology

## 2024-10-02 ENCOUNTER — Ambulatory Visit (INDEPENDENT_AMBULATORY_CARE_PROVIDER_SITE_OTHER): Admitting: Audiology

## 2024-10-02 ENCOUNTER — Other Ambulatory Visit: Payer: Self-pay | Admitting: Obstetrics and Gynecology

## 2024-10-02 ENCOUNTER — Institutional Professional Consult (permissible substitution) (INDEPENDENT_AMBULATORY_CARE_PROVIDER_SITE_OTHER): Admitting: Otolaryngology

## 2024-10-05 ENCOUNTER — Telehealth: Payer: Self-pay | Admitting: Family Medicine

## 2024-10-05 NOTE — Telephone Encounter (Signed)
 Patient called us  today inquiring about MS. Kelsey Ramsey for an upcoming surgery she has. She wanted more information regarding the surgery and accepted insurances for the procedure. We gave the patient Ms. Ramsey contact info so that she can further discuss scheduling with her.

## 2024-10-16 ENCOUNTER — Ambulatory Visit

## 2024-10-25 ENCOUNTER — Encounter

## 2024-10-25 ENCOUNTER — Encounter (INDEPENDENT_AMBULATORY_CARE_PROVIDER_SITE_OTHER): Payer: Self-pay | Admitting: Otolaryngology

## 2024-10-25 ENCOUNTER — Ambulatory Visit (INDEPENDENT_AMBULATORY_CARE_PROVIDER_SITE_OTHER): Admitting: Otolaryngology

## 2024-10-25 ENCOUNTER — Ambulatory Visit

## 2024-10-25 VITALS — BP 139/85 | HR 100 | Temp 99.4°F | Ht 67.0 in | Wt 220.0 lb

## 2024-10-25 DIAGNOSIS — J343 Hypertrophy of nasal turbinates: Secondary | ICD-10-CM

## 2024-10-25 DIAGNOSIS — H6981 Other specified disorders of Eustachian tube, right ear: Secondary | ICD-10-CM

## 2024-10-25 DIAGNOSIS — R0981 Nasal congestion: Secondary | ICD-10-CM | POA: Diagnosis not present

## 2024-10-25 DIAGNOSIS — J310 Chronic rhinitis: Secondary | ICD-10-CM

## 2024-10-25 DIAGNOSIS — J3089 Other allergic rhinitis: Secondary | ICD-10-CM

## 2024-10-25 MED ORDER — FLUTICASONE PROPIONATE 50 MCG/ACT NA SUSP: 2.0000 | Freq: Every day | NASAL | 10 refills | Status: AC

## 2024-10-25 NOTE — Progress Notes (Unsigned)
 CC: Recurrent right ear infections, chronic nasal congestion  Discussed the use of AI scribe software for clinical note transcription with the patient, who gave verbal consent to proceed.  History of Present Illness Kelsey Ramsey is a 28 year old female who presents with recurrent right ear infections.  She has been experiencing recurrent right ear infections for approximately one year, requiring antibiotics and occasionally a steroid shot for treatment. She estimates needing these treatments multiple times, though she did not specify an exact number.  She also has a history of chronic nasal congestion.  Her nose is sometimes congested, and she has experienced epistaxis when using nasal sprays. She ran out of her nasal spray and notes that it did not help with her nasal congestion. She mentions feeling hot and wonders if it might be related to her symptoms.  Currently she denies any otalgia, otorrhea, facial pain, or fever.     Past Medical History:  Diagnosis Date   Chlamydia infection 06/27/2021   Gonorrhea    Hypertension    Nephritic syndrome    Nephrotic syndrome    Trichomonal vaginitis     Past Surgical History:  Procedure Laterality Date   RENAL BIOPSY      Family History  Problem Relation Age of Onset   Diabetes Paternal Grandfather    Stroke Maternal Grandmother    Cancer Maternal Grandfather    Diabetes Maternal Grandfather    Hypertension Mother    Heart disease Mother    Thyroid disease Mother    Kidney Stones Maternal Uncle     Social History:  reports that she has quit smoking. Her smoking use included cigars. She has never been exposed to tobacco smoke. She has never used smokeless tobacco. She reports current alcohol use. She reports that she does not use drugs.  Allergies:  Allergies  Allergen Reactions   Lisinopril Shortness Of Breath   Pineapple Itching and Swelling   Latex Dermatitis    Prior to Admission medications   Medication Sig Start  Date End Date Taking? Authorizing Provider  escitalopram  (LEXAPRO ) 10 MG tablet Take 1 tablet (10 mg total) by mouth daily. 10/10/23  Yes Vonna Sharlet POUR, MD  losartan  (COZAAR ) 25 MG tablet Take 1 tablet (25 mg total) by mouth daily. 10/10/23  Yes Vonna Sharlet POUR, MD  ondansetron  (ZOFRAN -ODT) 4 MG disintegrating tablet Take 1 tablet (4 mg total) by mouth every 8 (eight) hours as needed for nausea or vomiting. 02/21/24  Yes Stuart Vernell Norris, PA-C  Prenat-FeFum-DSS-FA-DHA w/o A (PNV-DHA +DOCUSATE) 27-1.25-300 MG CAPS Take 1 capsule by mouth daily before breakfast. 02/02/23  Yes Rudy Carlin LABOR, MD  medroxyPROGESTERone  (DEPO-PROVERA ) 150 MG/ML injection Inject 1 mL (150 mg total) into the muscle every 3 (three) months. Patient not taking: Reported on 09/26/2019 12/28/18 12/06/19  Ervin, Michael L, MD    Blood pressure 139/85, pulse 100, temperature 99.4 F (37.4 C), temperature source Oral, height 5' 7 (1.702 m), weight 220 lb (99.8 kg), last menstrual period 09/16/2024, SpO2 95%. Exam: General: Communicates without difficulty, well nourished, no acute distress. Head: Normocephalic, no evidence injury, no tenderness, facial buttresses intact without stepoff. Face/sinus: No tenderness to palpation and percussion. Facial movement is normal and symmetric. Eyes: PERRL, EOMI. No scleral icterus, conjunctivae clear. Neuro: CN II exam reveals vision grossly intact.  No nystagmus at any point of gaze. Ears: Auricles well formed without lesions.  Ear canals are intact without mass or lesion.  No erythema or edema is appreciated.  The  TMs are intact without fluid. Nose: External evaluation reveals normal support and skin without lesions.  Dorsum is intact.  Anterior rhinoscopy reveals congested mucosa over anterior aspect of inferior turbinates and intact septum.  No purulence noted. Oral:  Oral cavity and oropharynx are intact, symmetric, without erythema or edema.  Mucosa is moist without lesions. Neck:  Full range of motion without pain.  There is no significant lymphadenopathy.  No masses palpable.  Thyroid bed within normal limits to palpation.  Parotid glands and submandibular glands equal bilaterally without mass.  Trachea is midline. Neuro:  CN 2-12 grossly intact.   Assessment and Plan Assessment & Plan Eustachian tube dysfunction with turbinate hypertrophy and allergic rhinitis Chronic right ear infections for approximately one year, likely due to eustachian tube dysfunction secondary to turbinate hypertrophy and allergic rhinitis. Nasal congestion and turbinate hypertrophy contribute to eustachian tube dysfunction. Current nasal congestion with swollen turbinates. No current ear infection.  - Prescribed Flonase  nasal spray with instructions for daily use: two sprays in each nostril, pointing towards the ear on the same side to avoid epistaxis. - Provided multiple refills for Flonase  nasal spray. - Advised to continue using Flonase  daily even if symptoms improve. - Scheduled follow-up appointment in four months to assess response to treatment, especially during spring season.    Olin Gurski W Felder Lebeda 10/25/2024, 4:30 PM

## 2024-10-27 DIAGNOSIS — H6981 Other specified disorders of Eustachian tube, right ear: Secondary | ICD-10-CM | POA: Insufficient documentation

## 2024-10-27 DIAGNOSIS — J310 Chronic rhinitis: Secondary | ICD-10-CM | POA: Insufficient documentation

## 2024-10-27 DIAGNOSIS — J343 Hypertrophy of nasal turbinates: Secondary | ICD-10-CM | POA: Insufficient documentation

## 2024-12-01 ENCOUNTER — Ambulatory Visit

## 2024-12-01 ENCOUNTER — Encounter

## 2025-01-22 ENCOUNTER — Ambulatory Visit: Payer: Self-pay | Admitting: Obstetrics and Gynecology

## 2025-02-28 ENCOUNTER — Ambulatory Visit (INDEPENDENT_AMBULATORY_CARE_PROVIDER_SITE_OTHER): Admitting: Otolaryngology
